# Patient Record
Sex: Female | Born: 1995 | Race: Black or African American | Marital: Single | Smoking: Never smoker
Health system: Southern US, Community
[De-identification: ages and names within clinical notes are randomized; demographics above are authoritative.]

## PROBLEM LIST (undated history)

## (undated) DIAGNOSIS — Z789 Other specified health status: Secondary | ICD-10-CM

## (undated) HISTORY — PX: WISDOM TOOTH EXTRACTION: SHX21

## (undated) HISTORY — DX: Other specified health status: Z78.9

---

## 2018-06-02 ENCOUNTER — Ambulatory Visit (INDEPENDENT_AMBULATORY_CARE_PROVIDER_SITE_OTHER): Payer: 59 | Admitting: Family Medicine

## 2018-06-02 ENCOUNTER — Encounter: Payer: Self-pay | Admitting: Family Medicine

## 2018-06-02 DIAGNOSIS — Z3401 Encounter for supervision of normal first pregnancy, first trimester: Secondary | ICD-10-CM

## 2018-06-02 DIAGNOSIS — Z124 Encounter for screening for malignant neoplasm of cervix: Secondary | ICD-10-CM | POA: Diagnosis not present

## 2018-06-02 DIAGNOSIS — Z1151 Encounter for screening for human papillomavirus (HPV): Secondary | ICD-10-CM

## 2018-06-02 DIAGNOSIS — Z23 Encounter for immunization: Secondary | ICD-10-CM | POA: Diagnosis not present

## 2018-06-02 DIAGNOSIS — Z113 Encounter for screening for infections with a predominantly sexual mode of transmission: Secondary | ICD-10-CM | POA: Diagnosis not present

## 2018-06-02 DIAGNOSIS — Z3400 Encounter for supervision of normal first pregnancy, unspecified trimester: Secondary | ICD-10-CM | POA: Insufficient documentation

## 2018-06-02 LAB — POCT URINALYSIS DIPSTICK OB
Blood, UA: NEGATIVE
Glucose, UA: NEGATIVE
KETONES UA: 40
Leukocytes, UA: NEGATIVE
POC,PROTEIN,UA: NEGATIVE
Spec Grav, UA: 1.015 (ref 1.010–1.025)
UROBILINOGEN UA: 2 U/dL — AB

## 2018-06-02 NOTE — Progress Notes (Signed)
  Subjective:  Angelica Gilbert is a G1P0 4457w5d being seen today for her first obstetrical visit.  Her obstetrical history is significant for first pregnancy. FOB involved: boyfriend. Unplanned pregnancy, but is excited about it.. Patient does intend to breast feed. Pregnancy history fully reviewed.  Patient reports no complaints.  BP 118/72   Pulse (!) 108   Ht 5\' 7"  (1.702 m)   Wt 128 lb (58.1 kg)   LMP 04/16/2018 (Exact Date)   BMI 20.05 kg/m   HISTORY: OB History  Gravida Para Term Preterm AB Living  1            SAB TAB Ectopic Multiple Live Births               # Outcome Date GA Lbr Len/2nd Weight Sex Delivery Anes PTL Lv  1 Current             History reviewed. No pertinent past medical history.  History reviewed. No pertinent surgical history.  Family History  Problem Relation Age of Onset  . Diabetes Father      Exam  BP 118/72   Pulse (!) 108   Ht 5\' 7"  (1.702 m)   Wt 128 lb (58.1 kg)   LMP 04/16/2018 (Exact Date)   BMI 20.05 kg/m   CONSTITUTIONAL: Well-developed, well-nourished female in no acute distress.  HENT:  Normocephalic, atraumatic, External right and left ear normal. Oropharynx is clear and moist EYES: Conjunctivae and EOM are normal. Pupils are equal, round, and reactive to light. No scleral icterus.  NECK: Normal range of motion, supple, no masses.  Normal thyroid.  CARDIOVASCULAR: Normal heart rate noted, regular rhythm RESPIRATORY: Clear to auscultation bilaterally. Effort and breath sounds normal, no problems with respiration noted. BREASTS: Symmetric in size. No masses, skin changes, nipple drainage, or lymphadenopathy. ABDOMEN: Soft, normal bowel sounds, no distention noted.  No tenderness, rebound or guarding.  PELVIC: Normal appearing external genitalia; normal appearing vaginal mucosa and cervix. No abnormal discharge noted. Normal uterine size, no other palpable masses, no uterine or adnexal tenderness. MUSCULOSKELETAL: Normal range of  motion. No tenderness.  No cyanosis, clubbing, or edema.  2+ distal pulses. SKIN: Skin is warm and dry. No rash noted. Not diaphoretic. No erythema. No pallor. NEUROLOGIC: Alert and oriented to person, place, and time. Normal reflexes, muscle tone coordination. No cranial nerve deficit noted. PSYCHIATRIC: Normal mood and affect. Normal behavior. Normal judgment and thought content.    Assessment:    Pregnancy: G1P0 Patient Active Problem List   Diagnosis Date Noted  . Supervision of normal first pregnancy, antepartum 06/02/2018      Plan:   1. Supervision of normal first pregnancy, antepartum FHT and FH normal. - Flu Vaccine QUAD 36+ mos IM - Obstetric Panel, Including HIV - Hemoglobinopathy Evaluation - SMN1 COPY NUMBER ANALYSIS (SMA Carrier Screen) - CHL AMB BABYSCRIPTS OPT IN - Cytology - PAP( [place]) - Hemoglobin A1c - POC Urinalysis Dipstick OB     Problem list reviewed and updated. 75% of 45 min visit spent on counseling and coordination of care.     Levie HeritageJacob J Yeng Perz 06/02/2018

## 2018-06-02 NOTE — Progress Notes (Signed)
DATING AND VIABILITY SONOGRAM   Adrienne MochaKayla Defrain is a 22 y.o. year old G1P0 with LMP Patient's last menstrual period was 04/16/2018 (exact date). which would correlate to  7217w5d weeks gestation.  She has regular menstrual cycles.   She is here today for a confirmatory initial sonogram.    GESTATION: SINGLETON     FETAL ACTIVITY:          Heart rate      145          The fetus is active.    ADNEXA: The ovaries are normal.   GESTATIONAL AGE AND  BIOMETRICS:  Gestational criteria: Estimated Date of Delivery: 01/21/19 by LMP now at 5617w5d  Previous Scans:0  GESTATIONAL SAC          2.66 cm         6-5 weeks  CROWN RUMP LENGTH           0.686 cm        6-3 weeks                                                                               AVERAGE EGA(BY THIS SCAN)6-3weeks  WORKING EDD( early ultrasound ):  01-21-19     TECHNICIAN COMMENTS: Patient informed that the ultrasound is considered a limited obstetric ultrasound and is not intended to be a complete ultrasound exam. Patient also informed that the ultrasound is not being completed with the intent of assessing for fetal or placental anomalies or any pelvic abnormalities. Explained that the purpose of today's ultrasound is to assess for fetal heart rate. Patient acknowledges the purpose of the exam and the limitations of the study.    Armandina StammerJennifer [place] Severs 06/02/2018 1:31 PM

## 2018-06-05 LAB — CULTURE, OB URINE

## 2018-06-05 LAB — URINE CULTURE, OB REFLEX

## 2018-06-07 ENCOUNTER — Telehealth: Payer: Self-pay

## 2018-06-07 MED ORDER — FOLIVANE-OB 130-92.4-1 MG PO CAPS: 1.0000 | ORAL_CAPSULE | Freq: Every day | ORAL | 10 refills | Status: DC

## 2018-06-07 NOTE — Telephone Encounter (Signed)
Patient requested prenatal vitamin. Angelica StammerJennifer Taylormarie Register RN

## 2018-06-08 LAB — CYTOLOGY - PAP
Chlamydia: NEGATIVE
Diagnosis: UNDETERMINED — AB
HPV: DETECTED — AB
NEISSERIA GONORRHEA: NEGATIVE

## 2018-06-09 ENCOUNTER — Encounter: Payer: Self-pay | Admitting: Family Medicine

## 2018-06-09 DIAGNOSIS — R87610 Atypical squamous cells of undetermined significance on cytologic smear of cervix (ASC-US): Secondary | ICD-10-CM | POA: Insufficient documentation

## 2018-06-09 DIAGNOSIS — N870 Mild cervical dysplasia: Secondary | ICD-10-CM | POA: Insufficient documentation

## 2018-06-09 DIAGNOSIS — R87810 Cervical high risk human papillomavirus (HPV) DNA test positive: Secondary | ICD-10-CM

## 2018-06-10 ENCOUNTER — Encounter: Payer: Self-pay | Admitting: Family Medicine

## 2018-06-10 DIAGNOSIS — D573 Sickle-cell trait: Secondary | ICD-10-CM | POA: Insufficient documentation

## 2018-06-10 DIAGNOSIS — O99019 Anemia complicating pregnancy, unspecified trimester: Secondary | ICD-10-CM

## 2018-06-10 LAB — OBSTETRIC PANEL, INCLUDING HIV
ANTIBODY SCREEN: NEGATIVE
BASOS: 0 %
Basophils Absolute: 0 10*3/uL (ref 0.0–0.2)
EOS (ABSOLUTE): 0 10*3/uL (ref 0.0–0.4)
EOS: 0 %
HIV Screen 4th Generation wRfx: NONREACTIVE
Hematocrit: 37.6 % (ref 34.0–46.6)
Hemoglobin: 12.7 g/dL (ref 11.1–15.9)
Hepatitis B Surface Ag: NEGATIVE
IMMATURE GRANS (ABS): 0 10*3/uL (ref 0.0–0.1)
Immature Granulocytes: 0 %
Lymphocytes Absolute: 2.2 10*3/uL (ref 0.7–3.1)
Lymphs: 21 %
MCH: 27.7 pg (ref 26.6–33.0)
MCHC: 33.8 g/dL (ref 31.5–35.7)
MCV: 82 fL (ref 79–97)
MONOS ABS: 0.6 10*3/uL (ref 0.1–0.9)
Monocytes: 6 %
Neutrophils Absolute: 7.3 10*3/uL — ABNORMAL HIGH (ref 1.4–7.0)
Neutrophils: 73 %
Platelets: 311 10*3/uL (ref 150–450)
RBC: 4.58 x10E6/uL (ref 3.77–5.28)
RDW: 13.6 % (ref 12.3–15.4)
RPR Ser Ql: NONREACTIVE
Rh Factor: NEGATIVE
Rubella Antibodies, IGG: 8.36 index (ref >0.99)
WBC: 10.2 10*3/uL (ref 3.4–10.8)

## 2018-06-10 LAB — SMN1 COPY NUMBER ANALYSIS (SMA CARRIER SCREENING)

## 2018-06-10 LAB — HEMOGLOBIN A1C
ESTIMATED AVERAGE GLUCOSE: 105 mg/dL
Hgb A1c MFr Bld: 5.3 % (ref 4.8–5.6)

## 2018-06-10 LAB — HEMOGLOBINOPATHY EVALUATION
Ferritin: 183 ng/mL — ABNORMAL HIGH (ref 15–150)
Hgb A2 Quant: 3.8 % — ABNORMAL HIGH (ref 1.8–3.2)
Hgb A: 60.9 % — ABNORMAL LOW (ref 96.4–98.8)
Hgb C: 0 %
Hgb F Quant: 0.5 % (ref 0.0–2.0)
Hgb S: 34.8 % — ABNORMAL HIGH
Hgb Solubility: POSITIVE — AB
Hgb Variant: 0 %

## 2018-06-22 NOTE — L&D Delivery Note (Signed)
Patient: Angelica Gilbert MRN: [id number]  GBS status: Positive, IAP given: PCN   Patient is a 23 y.o. now G1P1 s/p NSVD at [redacted]w[redacted]d, who was admitted for SOL. AROM approximately 80 minutes prior to delivery with clear  fluid.    Delivery Note At 1:57 AM a viable female was delivered via Vaginal, Spontaneous (Presentation: LOA).  APGAR: 9, 9; weight 7 lb 2.6 oz (3250 g).   Placenta status: spontaneous, intact.  Cord: 3 vessel with the following complications: nuchal x1.    Anesthesia:  Epidural  Episiotomy: None Lacerations: Periurethral Suture Repair: None  Est. Blood Loss (mL): 26  Head delivered LOA. Nuchal cord x1 noted, unable to reduce at perineum. Shoulder and body delivered in usual fashion. Nuchal cord then reduced. Infant with spontaneous cry, placed on mother's abdomen, dried and bulb suctioned. Cord clamped x 2 after 1-minute delay, and cut by family member. Cord blood drawn. Placenta delivered spontaneously with gentle cord traction. Fundus firm with massage and Pitocin. Perineum and vagina inspected and found to have small, shallow periurethral laceration, which was found to be hemostatic.  Mom to postpartum.  Baby to Couplet care / Skin to Skin.  Mother asymptomatic COVID+. Requests to remain with baby.   Melina Schools 01/10/2019, 8:25 AM

## 2018-06-24 MED ORDER — PROMETHAZINE HCL 25 MG PO TABS: 25.0000 mg | ORAL_TABLET | Freq: Four times a day (QID) | ORAL | 2 refills | Status: DC | PRN

## 2018-06-24 NOTE — Addendum Note (Signed)
Addended by: Levie Heritage on: 06/24/2018 04:02 PM   Modules accepted: Orders

## 2018-06-30 ENCOUNTER — Ambulatory Visit (INDEPENDENT_AMBULATORY_CARE_PROVIDER_SITE_OTHER): Payer: 59 | Admitting: Obstetrics & Gynecology

## 2018-06-30 VITALS — BP 125/66 | HR 119 | Wt 131.0 lb

## 2018-06-30 DIAGNOSIS — O99019 Anemia complicating pregnancy, unspecified trimester: Secondary | ICD-10-CM

## 2018-06-30 DIAGNOSIS — R87810 Cervical high risk human papillomavirus (HPV) DNA test positive: Secondary | ICD-10-CM

## 2018-06-30 DIAGNOSIS — D573 Sickle-cell trait: Secondary | ICD-10-CM

## 2018-06-30 DIAGNOSIS — Z3401 Encounter for supervision of normal first pregnancy, first trimester: Secondary | ICD-10-CM

## 2018-06-30 DIAGNOSIS — O99011 Anemia complicating pregnancy, first trimester: Secondary | ICD-10-CM

## 2018-06-30 DIAGNOSIS — R87610 Atypical squamous cells of undetermined significance on cytologic smear of cervix (ASC-US): Secondary | ICD-10-CM

## 2018-06-30 DIAGNOSIS — Z3400 Encounter for supervision of normal first pregnancy, unspecified trimester: Secondary | ICD-10-CM

## 2018-06-30 DIAGNOSIS — Z3A10 10 weeks gestation of pregnancy: Secondary | ICD-10-CM

## 2018-06-30 NOTE — Addendum Note (Signed)
Addended by: Mikey Bussing on: 06/30/2018 04:29 PM   Modules accepted: Orders

## 2018-06-30 NOTE — Progress Notes (Signed)
   PRENATAL VISIT NOTE  Subjective:  Angelica Gilbert is a 23 y.o. G1P0 at [redacted]w[redacted]d being seen today for ongoing prenatal care.  She is currently monitored for the following issues for this low-risk pregnancy and has Supervision of normal first pregnancy, antepartum; ASCUS with positive high risk HPV cervical; and Sickle cell trait in mother affecting pregnancy (HCC) on their problem list.  Patient reports no complaints.the N/V are improving. Pt has a conjunctival hemorrhage which is improving.   Contractions: Not present. Vag. Bleeding: None.  Movement: Absent. Denies leaking of fluid.   The following portions of the patient's history were reviewed and updated as appropriate: allergies, current medications, past family history, past medical history, past social history, past surgical history and problem list. Problem list updated.  Objective:   Vitals:   06/30/18 1542  BP: 125/66  Pulse: (!) 119  Weight: 131 lb (59.4 kg)    Fetal Status:     Movement: Absent     General:  Alert, oriented and cooperative. Patient is in no acute distress.  Skin: Skin is warm and dry. No rash noted.   Cardiovascular: Normal heart rate noted  Respiratory: Normal respiratory effort, no problems with respiration noted  Abdomen: Soft, gravid, appropriate for gestational age.  Pain/Pressure: Absent     Pelvic: Cervical exam deferred        Extremities: Normal range of motion.  Edema: None  Mental Status: Normal mood and affect. Normal behavior. Normal judgment and thought content.   Assessment and Plan:  Pregnancy: G1P0 at [redacted]w[redacted]d  1. Supervision of normal first pregnancy, antepartum Panorama today  2. Sickle cell trait in mother affecting pregnancy (HCC) Rec testing for FOB  3. ASCUS with positive high risk HPV cervical  Preterm labor symptoms and general obstetric precautions including but not limited to vaginal bleeding, contractions, leaking of fluid and fetal movement were reviewed in detail with the  patient. Please refer to After Visit Summary for other counseling recommendations.  Return in about 4 weeks (around 07/28/2018).  No future appointments.  Willodean Rosenthal, MD

## 2018-07-04 ENCOUNTER — Telehealth: Payer: Self-pay

## 2018-07-04 NOTE — Telephone Encounter (Signed)
Received a form from [place] stating that patient's panorama specimen can not be processed because of missing information.   Glory Buff, spoke with Shantel. Shantel states that Angelica Gilbert wants to confirm that the patient is having a singleton.

## 2018-07-11 DIAGNOSIS — Z3400 Encounter for supervision of normal first pregnancy, unspecified trimester: Secondary | ICD-10-CM

## 2018-07-28 ENCOUNTER — Ambulatory Visit (INDEPENDENT_AMBULATORY_CARE_PROVIDER_SITE_OTHER): Payer: 59 | Admitting: Family Medicine

## 2018-07-28 ENCOUNTER — Encounter: Payer: Self-pay | Admitting: Family Medicine

## 2018-07-28 VITALS — BP 103/75 | HR 85 | Wt 133.0 lb

## 2018-07-28 DIAGNOSIS — O99012 Anemia complicating pregnancy, second trimester: Secondary | ICD-10-CM

## 2018-07-28 DIAGNOSIS — D573 Sickle-cell trait: Secondary | ICD-10-CM

## 2018-07-28 DIAGNOSIS — O99019 Anemia complicating pregnancy, unspecified trimester: Secondary | ICD-10-CM

## 2018-07-28 DIAGNOSIS — Z3402 Encounter for supervision of normal first pregnancy, second trimester: Secondary | ICD-10-CM

## 2018-07-28 DIAGNOSIS — Z3400 Encounter for supervision of normal first pregnancy, unspecified trimester: Secondary | ICD-10-CM

## 2018-07-28 NOTE — Addendum Note (Signed)
Addended by: Levie Heritage on: 07/28/2018 03:22 PM   Modules accepted: Orders

## 2018-07-28 NOTE — Progress Notes (Signed)
   PRENATAL VISIT NOTE  Subjective:  Angelica Gilbert is a 23 y.o. G1P0 at [redacted]w[redacted]d being seen today for ongoing prenatal care.  She is currently monitored for the following issues for this low-risk pregnancy and has Supervision of normal first pregnancy, antepartum; ASCUS with positive high risk HPV cervical; and Sickle cell trait in mother affecting pregnancy (HCC) on their problem list.  Patient reports no complaints.  Contractions: Not present. Vag. Bleeding: None.  Movement: Absent. Denies leaking of fluid.   The following portions of the patient's history were reviewed and updated as appropriate: allergies, current medications, past family history, past medical history, past social history, past surgical history and problem list. Problem list updated.  Objective:   Vitals:   07/28/18 1451  BP: 103/75  Pulse: 85  Weight: 133 lb (60.3 kg)    Fetal Status: Fetal Heart Rate (bpm): 162   Movement: Absent     General:  Alert, oriented and cooperative. Patient is in no acute distress.  Skin: Skin is warm and dry. No rash noted.   Cardiovascular: Normal heart rate noted  Respiratory: Normal respiratory effort, no problems with respiration noted  Abdomen: Soft, gravid, appropriate for gestational age.  Pain/Pressure: Present     Pelvic: Cervical exam deferred        Extremities: Normal range of motion.  Edema: None  Mental Status: Normal mood and affect. Normal behavior. Normal judgment and thought content.   Assessment and Plan:  Pregnancy: G1P0 at [redacted]w[redacted]d  1. Supervision of normal first pregnancy, antepartum FHT and FH normal - [place] MFM OB Comp Less 14 Wks; Future  2. Sickle cell trait in mother affecting pregnancy (HCC)   Preterm labor symptoms and general obstetric precautions including but not limited to vaginal bleeding, contractions, leaking of fluid and fetal movement were reviewed in detail with the patient. Please refer to After Visit Summary for other counseling recommendations.   No follow-ups on file.  Future Appointments  Date Time Provider Department Center  08/25/2018  9:45 AM Levie Heritage, DO [facility] None    Levie Heritage, DO

## 2018-08-25 ENCOUNTER — Ambulatory Visit (HOSPITAL_COMMUNITY): Payer: 59

## 2018-08-25 ENCOUNTER — Ambulatory Visit (INDEPENDENT_AMBULATORY_CARE_PROVIDER_SITE_OTHER): Payer: 59 | Admitting: Family Medicine

## 2018-08-25 VITALS — BP 110/73 | HR 100 | Wt 135.1 lb

## 2018-08-25 DIAGNOSIS — Z3400 Encounter for supervision of normal first pregnancy, unspecified trimester: Secondary | ICD-10-CM

## 2018-08-25 DIAGNOSIS — Z3A18 18 weeks gestation of pregnancy: Secondary | ICD-10-CM

## 2018-08-25 DIAGNOSIS — O99019 Anemia complicating pregnancy, unspecified trimester: Secondary | ICD-10-CM

## 2018-08-25 DIAGNOSIS — O99012 Anemia complicating pregnancy, second trimester: Secondary | ICD-10-CM

## 2018-08-25 DIAGNOSIS — D573 Sickle-cell trait: Secondary | ICD-10-CM

## 2018-08-25 NOTE — Progress Notes (Signed)
   PRENATAL VISIT NOTE  Subjective:  Angelica Gilbert is a 23 y.o. G1P0 at [redacted]w[redacted]d being seen today for ongoing prenatal care.  She is currently monitored for the following issues for this low-risk pregnancy and has Supervision of normal first pregnancy, antepartum; ASCUS with positive high risk HPV cervical; and Sickle cell trait in mother affecting pregnancy (HCC) on their problem list.  Patient reports no complaints.  Contractions: Not present. Vag. Bleeding: None.  Movement: Present. Denies leaking of fluid.   The following portions of the patient's history were reviewed and updated as appropriate: allergies, current medications, past family history, past medical history, past social history, past surgical history and problem list. Problem list updated.  Objective:   Vitals:   08/25/18 0943  BP: 110/73  Pulse: 100  Weight: 135 lb 1.3 oz (61.3 kg)    Fetal Status: Fetal Heart Rate (bpm): 160   Movement: Present     General:  Alert, oriented and cooperative. Patient is in no acute distress.  Skin: Skin is warm and dry. No rash noted.   Cardiovascular: Normal heart rate noted  Respiratory: Normal respiratory effort, no problems with respiration noted  Abdomen: Soft, gravid, appropriate for gestational age.  Pain/Pressure: Present     Pelvic: Cervical exam deferred        Extremities: Normal range of motion.  Edema: None  Mental Status: Normal mood and affect. Normal behavior. Normal judgment and thought content.   Assessment and Plan:  Pregnancy: G1P0 at [redacted]w[redacted]d  1. Supervision of normal first pregnancy, antepartum FHT and FH normal  2. Sickle cell trait in mother affecting pregnancy (HCC)   Preterm labor symptoms and general obstetric precautions including but not limited to vaginal bleeding, contractions, leaking of fluid and fetal movement were reviewed in detail with the patient. Please refer to After Visit Summary for other counseling recommendations.  Return in about 4 weeks  (around 09/22/2018).  Future Appointments  Date Time Provider Department Center  08/31/2018 10:45 AM [facility] [place] 2 [facility]    Levie Heritage, DO

## 2018-08-31 ENCOUNTER — Other Ambulatory Visit: Payer: Self-pay

## 2018-08-31 ENCOUNTER — Ambulatory Visit (HOSPITAL_COMMUNITY)
Admission: RE | Admit: 2018-08-31 | Discharge: 2018-08-31 | Disposition: A | Discharge status: Home / Self Care | Payer: 59 | Source: Ambulatory Visit | Attending: Family Medicine | Admitting: Family Medicine

## 2018-08-31 ENCOUNTER — Other Ambulatory Visit (HOSPITAL_COMMUNITY): Payer: Self-pay | Admitting: *Deleted

## 2018-08-31 ENCOUNTER — Other Ambulatory Visit: Payer: Self-pay | Admitting: Family Medicine

## 2018-08-31 DIAGNOSIS — Z363 Encounter for antenatal screening for malformations: Secondary | ICD-10-CM | POA: Diagnosis not present

## 2018-08-31 DIAGNOSIS — Z3400 Encounter for supervision of normal first pregnancy, unspecified trimester: Secondary | ICD-10-CM | POA: Diagnosis not present

## 2018-08-31 DIAGNOSIS — Z3A19 19 weeks gestation of pregnancy: Secondary | ICD-10-CM | POA: Diagnosis not present

## 2018-08-31 DIAGNOSIS — Z862 Personal history of diseases of the blood and blood-forming organs and certain disorders involving the immune mechanism: Secondary | ICD-10-CM | POA: Diagnosis not present

## 2018-08-31 DIAGNOSIS — Z362 Encounter for other antenatal screening follow-up: Secondary | ICD-10-CM

## 2018-09-22 ENCOUNTER — Ambulatory Visit (INDEPENDENT_AMBULATORY_CARE_PROVIDER_SITE_OTHER): Payer: 59 | Admitting: Obstetrics & Gynecology

## 2018-09-22 ENCOUNTER — Encounter: Payer: Self-pay | Admitting: Obstetrics & Gynecology

## 2018-09-22 DIAGNOSIS — Z3A22 22 weeks gestation of pregnancy: Secondary | ICD-10-CM

## 2018-09-22 DIAGNOSIS — R87610 Atypical squamous cells of undetermined significance on cytologic smear of cervix (ASC-US): Secondary | ICD-10-CM

## 2018-09-22 DIAGNOSIS — O99012 Anemia complicating pregnancy, second trimester: Secondary | ICD-10-CM

## 2018-09-22 DIAGNOSIS — Z3400 Encounter for supervision of normal first pregnancy, unspecified trimester: Secondary | ICD-10-CM

## 2018-09-22 DIAGNOSIS — O99019 Anemia complicating pregnancy, unspecified trimester: Secondary | ICD-10-CM

## 2018-09-22 DIAGNOSIS — D573 Sickle-cell trait: Secondary | ICD-10-CM

## 2018-09-22 DIAGNOSIS — R87810 Cervical high risk human papillomavirus (HPV) DNA test positive: Secondary | ICD-10-CM

## 2018-09-22 NOTE — Progress Notes (Signed)
Patient complaining of really bad reflux. Armandina Stammer RN

## 2018-09-22 NOTE — Progress Notes (Signed)
   TELEHEALTH VIRTUAL OBSTETRICS VISIT ENCOUNTER NOTE  I connected with Angelica Gilbert on 09/22/18 at  4:00 PM EDT by telephone at home and verified that I am speaking with the correct person using two identifiers.   I discussed the limitations, risks, security and privacy concerns of performing an evaluation and management service by telephone and the availability of in person appointments. I also discussed with the patient that there may be a patient responsible charge related to this service. The patient expressed understanding and agreed to proceed.  Subjective:  Angelica Gilbert is a 22 y.o. G1P0 at [redacted]w[redacted]d being followed for ongoing prenatal care.  She is currently monitored for the following issues for this low-risk pregnancy and has Supervision of normal first pregnancy, antepartum; ASCUS with positive high risk HPV cervical; and Sickle cell trait in mother affecting pregnancy (HCC) on their problem list.  Patient reports no complaints. Reports fetal movement. Denies any contractions, bleeding or leaking of fluid.   The following portions of the patient's history were reviewed and updated as appropriate: allergies, current medications, past family history, past medical history, past social history, past surgical history and problem list.   Objective:   General:  Alert, oriented and cooperative.   Mental Status: Normal mood and affect perceived. Normal judgment and thought content.  Rest of physical exam deferred due to type of encounter  Assessment and Plan:  Pregnancy: G1P0 at [redacted]w[redacted]d 1. Supervision of normal first pregnancy, antepartum  2. Sickle cell trait in mother affecting pregnancy (HCC)  3. ASCUS with positive high risk HPV cervical   Preterm labor symptoms and general obstetric precautions including but not limited to vaginal bleeding, contractions, leaking of fluid and fetal movement were reviewed in detail with the patient.  I discussed the assessment and treatment plan with  the patient. The patient was provided an opportunity to ask questions and all were answered. The patient agreed with the plan and demonstrated an understanding of the instructions. The patient was advised to call back or seek an in-person office evaluation/go to [facility] at [facility] for any urgent or concerning symptoms. Please refer to After Visit Summary for other counseling recommendations.   I provided 12 minutes of non-face-to-face time during this encounter.  Return in about 5 weeks (around 10/27/2018).  Future Appointments  Date Time Provider Department Center  10/27/2018  3:40 PM [facility] NURSE [facility]  10/27/2018  3:45 PM [facility] [place] 2 [facility]    Willodean Rosenthal, MD Center for [facility], [facility]

## 2018-09-29 ENCOUNTER — Ambulatory Visit (HOSPITAL_COMMUNITY): Payer: 59

## 2018-10-27 ENCOUNTER — Ambulatory Visit (INDEPENDENT_AMBULATORY_CARE_PROVIDER_SITE_OTHER): Payer: 59 | Admitting: Family Medicine

## 2018-10-27 ENCOUNTER — Ambulatory Visit (HOSPITAL_COMMUNITY)
Admission: RE | Admit: 2018-10-27 | Discharge: 2018-10-27 | Disposition: A | Discharge status: Home / Self Care | Payer: 59 | Source: Ambulatory Visit | Attending: Obstetrics and Gynecology | Admitting: Obstetrics and Gynecology

## 2018-10-27 ENCOUNTER — Encounter (HOSPITAL_COMMUNITY): Payer: Self-pay | Admitting: *Deleted

## 2018-10-27 ENCOUNTER — Ambulatory Visit (HOSPITAL_COMMUNITY): Payer: 59 | Admitting: *Deleted

## 2018-10-27 ENCOUNTER — Other Ambulatory Visit: Payer: Self-pay

## 2018-10-27 ENCOUNTER — Encounter: Payer: Self-pay | Admitting: Family Medicine

## 2018-10-27 VITALS — BP 124/72 | HR 92 | Wt 136.0 lb

## 2018-10-27 DIAGNOSIS — Z3400 Encounter for supervision of normal first pregnancy, unspecified trimester: Secondary | ICD-10-CM | POA: Diagnosis present

## 2018-10-27 DIAGNOSIS — Z362 Encounter for other antenatal screening follow-up: Secondary | ICD-10-CM | POA: Diagnosis not present

## 2018-10-27 DIAGNOSIS — Z3A27 27 weeks gestation of pregnancy: Secondary | ICD-10-CM | POA: Diagnosis not present

## 2018-10-27 DIAGNOSIS — Z862 Personal history of diseases of the blood and blood-forming organs and certain disorders involving the immune mechanism: Secondary | ICD-10-CM | POA: Diagnosis not present

## 2018-10-27 DIAGNOSIS — O99019 Anemia complicating pregnancy, unspecified trimester: Secondary | ICD-10-CM

## 2018-10-27 DIAGNOSIS — O360920 Maternal care for other rhesus isoimmunization, second trimester, not applicable or unspecified: Secondary | ICD-10-CM

## 2018-10-27 DIAGNOSIS — O26899 Other specified pregnancy related conditions, unspecified trimester: Secondary | ICD-10-CM

## 2018-10-27 DIAGNOSIS — O99012 Anemia complicating pregnancy, second trimester: Secondary | ICD-10-CM

## 2018-10-27 DIAGNOSIS — O26892 Other specified pregnancy related conditions, second trimester: Secondary | ICD-10-CM

## 2018-10-27 DIAGNOSIS — Z23 Encounter for immunization: Secondary | ICD-10-CM

## 2018-10-27 DIAGNOSIS — D573 Sickle-cell trait: Secondary | ICD-10-CM

## 2018-10-27 DIAGNOSIS — Z6791 Unspecified blood type, Rh negative: Secondary | ICD-10-CM | POA: Insufficient documentation

## 2018-10-27 MED ORDER — RHO D IMMUNE GLOBULIN 1500 UNIT/2ML IJ SOSY: 300.0000 ug | PREFILLED_SYRINGE | Freq: Once | INTRAMUSCULAR | Status: DC

## 2018-10-27 NOTE — Progress Notes (Signed)
error 

## 2018-10-27 NOTE — Progress Notes (Signed)
   PRENATAL VISIT NOTE  Subjective:  Angelica Gilbert is a 23 y.o. G1P0 at [redacted]w[redacted]d being seen today for ongoing prenatal care.  She is currently monitored for the following issues for this low-risk pregnancy and has Supervision of normal first pregnancy, antepartum; ASCUS with positive high risk HPV cervical; and Sickle cell trait in mother affecting pregnancy (HCC) on their problem list.  Patient reports no complaints.  Contractions: Not present. Vag. Bleeding: None.  Movement: Present. Denies leaking of fluid.   The following portions of the patient's history were reviewed and updated as appropriate: allergies, current medications, past family history, past medical history, past social history, past surgical history and problem list.   Objective:   Vitals:   10/27/18 0838  BP: 124/72  Pulse: 92    Fetal Status: Fetal Heart Rate (bpm): 155   Movement: Present     General:  Alert, oriented and cooperative. Patient is in no acute distress.  Skin: Skin is warm and dry. No rash noted.   Cardiovascular: Normal heart rate noted  Respiratory: Normal respiratory effort, no problems with respiration noted  Abdomen: Soft, gravid, appropriate for gestational age.  Pain/Pressure: Present     Pelvic: Cervical exam deferred        Extremities: Normal range of motion.  Edema: None  Mental Status: Normal mood and affect. Normal behavior. Normal judgment and thought content.   Assessment and Plan:  Pregnancy: G1P0 at [redacted]w[redacted]d 1. Supervision of normal first pregnancy, antepartum FHT and FH normal - Glucose Tolerance, 2 Hours w/1 Hour - CBC - HIV antibody (with reflex) - RPR - ABO AND RH   2. Rh negative state in antepartum period Will return next week for rhogam  3. Sickle cell trait in mother affecting pregnancy (HCC)   Preterm labor symptoms and general obstetric precautions including but not limited to vaginal bleeding, contractions, leaking of fluid and fetal movement were reviewed in detail  with the patient. Please refer to After Visit Summary for other counseling recommendations.   Return in about 4 weeks (around 11/24/2018) for OB f/u, virtual.  Future Appointments  Date Time Provider Department Center  10/27/2018  3:40 PM [facility] NURSE [facility]  10/27/2018  3:45 PM [facility] [place] 2 [facility]    Levie Heritage, DO

## 2018-10-28 LAB — GLUCOSE TOLERANCE, 2 HOURS W/ 1HR
Glucose, 1 hour: 115 mg/dL (ref 65–179)
Glucose, 2 hour: 106 mg/dL (ref 65–152)
Glucose, Fasting: 70 mg/dL (ref 65–91)

## 2018-10-28 LAB — CBC
Hematocrit: 36.9 % (ref 34.0–46.6)
Hemoglobin: 12.2 g/dL (ref 11.1–15.9)
MCH: 27.7 pg (ref 26.6–33.0)
MCHC: 33.1 g/dL (ref 31.5–35.7)
MCV: 84 fL (ref 79–97)
Platelets: 230 10*3/uL (ref 150–450)
RBC: 4.41 x10E6/uL (ref 3.77–5.28)
RDW: 13.7 % (ref 11.7–15.4)
WBC: 12.2 10*3/uL — ABNORMAL HIGH (ref 3.4–10.8)

## 2018-10-28 LAB — HIV ANTIBODY (ROUTINE TESTING W REFLEX): HIV Screen 4th Generation wRfx: NONREACTIVE

## 2018-10-28 LAB — RPR: RPR Ser Ql: NONREACTIVE

## 2018-10-28 LAB — ABO AND RH: Rh Factor: NEGATIVE

## 2018-10-28 LAB — ANTIBODY SCREEN: Antibody Screen: NEGATIVE

## 2018-11-01 ENCOUNTER — Ambulatory Visit (INDEPENDENT_AMBULATORY_CARE_PROVIDER_SITE_OTHER): Payer: 59

## 2018-11-01 ENCOUNTER — Other Ambulatory Visit: Payer: Self-pay

## 2018-11-01 DIAGNOSIS — Z6791 Unspecified blood type, Rh negative: Secondary | ICD-10-CM

## 2018-11-01 DIAGNOSIS — O360130 Maternal care for anti-D [Rh] antibodies, third trimester, not applicable or unspecified: Secondary | ICD-10-CM

## 2018-11-01 NOTE — Progress Notes (Signed)
Chart reviewed - agree with RN documentation.   

## 2018-11-01 NOTE — Progress Notes (Signed)
Patient presents for Rhogam shot. Patient blood type A-

## 2018-11-24 ENCOUNTER — Ambulatory Visit (INDEPENDENT_AMBULATORY_CARE_PROVIDER_SITE_OTHER): Payer: 59 | Admitting: Family Medicine

## 2018-11-24 DIAGNOSIS — O99019 Anemia complicating pregnancy, unspecified trimester: Secondary | ICD-10-CM

## 2018-11-24 DIAGNOSIS — O99013 Anemia complicating pregnancy, third trimester: Secondary | ICD-10-CM

## 2018-11-24 DIAGNOSIS — O26899 Other specified pregnancy related conditions, unspecified trimester: Secondary | ICD-10-CM

## 2018-11-24 DIAGNOSIS — Z3A31 31 weeks gestation of pregnancy: Secondary | ICD-10-CM

## 2018-11-24 DIAGNOSIS — D573 Sickle-cell trait: Secondary | ICD-10-CM

## 2018-11-24 DIAGNOSIS — Z3400 Encounter for supervision of normal first pregnancy, unspecified trimester: Secondary | ICD-10-CM

## 2018-11-24 DIAGNOSIS — O26893 Other specified pregnancy related conditions, third trimester: Secondary | ICD-10-CM

## 2018-11-24 DIAGNOSIS — Z6791 Unspecified blood type, Rh negative: Secondary | ICD-10-CM

## 2018-11-24 DIAGNOSIS — O360930 Maternal care for other rhesus isoimmunization, third trimester, not applicable or unspecified: Secondary | ICD-10-CM

## 2018-11-24 NOTE — Progress Notes (Signed)
Patient agrees to [employer]. Armandina Stammer RN

## 2018-11-24 NOTE — Progress Notes (Signed)
TELEHEALTH OBSTETRICS PRENATAL VIRTUAL VIDEO VISIT ENCOUNTER NOTE  Provider [facility]: [facility]   I connected with Angelica Gilbert on 11/24/18 at  9:15 AM EDT by WebEx Video Encounter at home and verified that I am speaking with the correct person using two identifiers.   I discussed the limitations, risks, security and privacy concerns of performing an evaluation and management service by telephone and the availability of in person appointments. I also discussed with the patient that there may be a patient responsible charge related to this service. The patient expressed understanding and agreed to proceed. Subjective:  Angelica Gilbert is a 23 y.o. G1P0 at 1363w5d being seen today for ongoing prenatal care.  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  Patient reports occasional contractions.  Contractions: Not present. Vag. Bleeding: None.  Movement: Present. Denies any leaking of fluid.   The following portions of the patient's history were reviewed and updated as appropriate: allergies, current medications, past family history, past medical history, past social history, past surgical history and problem list.   Objective:  There were no vitals filed for this visit.  Fetal Status:     Movement: Present     General:  Alert, oriented and cooperative. Patient is in no acute distress.  Respiratory: Normal respiratory effort, no problems with respiration noted  Mental Status: Normal mood and affect. Normal behavior. Normal judgment and thought content.  Rest of physical exam deferred due to type of encounter  Imaging: [place] Mfm Ob Follow Up  Result Date: 10/28/2018  ----------------------------------------------------------------------  OBSTETRICS REPORT                       (Signed Final 10/28/2018 11:35 am) ---------------------------------------------------------------------- Patient Info  ID #:       [id number]                          D.O.B.:  07-24-1995 (22 yrs)  Name:       Angelica Gilbert                    Visit Date: 10/27/2018 03:24 pm ---------------------------------------------------------------------- Performed By  Performed By:     Percell BostonHeather Waken          [address]. Address[address]     [address]                                                             [address]  Attending:        Lin Landsmanorenthian Booker      [facility]  Referred By:      [facility] ---------------------------------------------------------------------- Orders   #  Description                          Code         Ordered By   1  [place] MFM OB FOLLOW UP                  [id number]     RAVI SHANKAR  ----------------------------------------------------------------------   #  Order #                    Accession #                 Episode #   1  [id number]                  [id number]                  [id number]  ---------------------------------------------------------------------- Indications   [redacted] weeks gestation of pregnancy                Z3A.27   History of sickle cell trait (FOB status       Z86.2   unknown)   Encounter for other antenatal screening        Z36.2   follow-up (low risk NIPS)  ---------------------------------------------------------------------- Fetal Evaluation  Num Of Fetuses:         1  Fetal Heart Rate(bpm):  116  Cardiac Activity:       Observed  Presentation:           Cephalic  Placenta:               Anterior  P. Cord Insertion:      Previously Visualized  Amniotic Fluid  AFI FV:      Within normal limits                              Largest Pocket(cm)                               6.96 ---------------------------------------------------------------------- Biometry  BPD:      73.4  mm     G. Age:  29w 3d         88  %    CI:        79.66   %    70 - 86                                                          FL/HC:      20.2   %    18.8 - 20.6  HC:      259.9  mm     G. Age:  28w 2d         38  %    HC/AC:      1.12        1.05 - 1.21  AC:      231.7  mm     G. Age:  27w 4d         37  %  FL/BPD:     71.4   %    71 - 87  FL:       52.4  mm     G. Age:  27w 6d         41  %    FL/AC:      22.6   %    20 - 24  HUM:      46.3  mm     G. Age:  27w 2d         37  %  Est. FW:    1136  gm      2 lb 8 oz     54  % ---------------------------------------------------------------------- OB History  Gravidity:    1         Term:   0        Prem:   0        SAB:   0  TOP:          0       Ectopic:  0        Living: 0 ---------------------------------------------------------------------- Gestational Age  LMP:           27w 5d        Date:  04/16/18                 EDD:   01/21/19  U/S Today:     28w 2d                                        EDD:   01/17/19  Best:          27w 5d     Det. By:  LMP  (04/16/18)          EDD:   01/21/19 ---------------------------------------------------------------------- Anatomy  Cranium:               Appears normal         LVOT:                   Appears normal  Cavum:                 Previously seen        Aortic Arch:            Previously seen  Ventricles:            Appears normal         Ductal Arch:            Previously seen  Choroid Plexus:        Previously seen        Diaphragm:              Appears normal  Cerebellum:            Previously seen        Stomach:                Appears normal, left  sided  Posterior Fossa:       Previously seen        Abdomen:                Previously seen  Nuchal Fold:           Previously seen        Abdominal Wall:         Previously seen  Face:                   Orbits and profile     Cord Vessels:           Previously seen                         previously seen  Lips:                  Previously seen        Kidneys:                Appear normal  Palate:                Previously seen        Bladder:                Appears normal  Thoracic:              Appears normal         Spine:                  Appears normal  Heart:                 Appears normal         Upper Extremities:      Previously seen                         (4CH, axis, and                         situs)  RVOT:                  Appears normal         Lower Extremities:      Previously seen  Other:  Heels and 5th digit visualized previously. ---------------------------------------------------------------------- Cervix Uterus Adnexa  Cervix  Not visualized (advanced GA >24wks)  Uterus  No abnormality visualized.  Left Ovary  No adnexal mass visualized.  Right Ovary  No adnexal mass visualized.  Cul De Sac  No free fluid seen.  Adnexa  No abnormality visualized. ---------------------------------------------------------------------- Impression  Normal interval growth. ---------------------------------------------------------------------- Recommendations  Follow up growth as clinically indicated. ----------------------------------------------------------------------               Lin Landsman, MD Electronically Signed Final Report   10/28/2018 11:35 am ----------------------------------------------------------------------   Assessment and Plan:  Pregnancy: G1P0 at [redacted]w[redacted]d 1. Supervision of normal first pregnancy, antepartum Good fetal activity  2. Rh negative state in antepartum period Rhogam given at 28 weeks.  3. Sickle cell trait in mother affecting pregnancy (HCC)   Preterm labor symptoms and general obstetric precautions including but not limited to vaginal bleeding, contractions, leaking of fluid and fetal movement were reviewed in detail with the patient. I discussed the  assessment and treatment plan with the patient. The patient was provided an opportunity to ask questions and all were answered. The patient  agreed with the plan and demonstrated an understanding of the instructions. The patient was advised to call back or seek an in-person office evaluation/go to MAU at [facility] for any urgent or concerning symptoms. Please refer to After Visit Summary for other counseling recommendations.    I provided 13 minutes of face-to-face time during this encounter.  Return in about 4 weeks (around 12/22/2018) for OB f/u, In Office - [facility].  Future Appointments  Date Time Provider Department Center  11/24/2018  9:15 AM Levie Heritage, DO [facility] None    Levie Heritage, DO Center for [employer], [facility]

## 2018-12-20 ENCOUNTER — Other Ambulatory Visit: Payer: Self-pay

## 2018-12-20 ENCOUNTER — Telehealth: Payer: Self-pay

## 2018-12-20 ENCOUNTER — Inpatient Hospital Stay (HOSPITAL_COMMUNITY)
Admission: AD | Admit: 2018-12-20 | Discharge: 2018-12-20 | Disposition: A | Discharge status: Home / Self Care | Payer: 59 | Attending: Obstetrics and Gynecology | Admitting: Obstetrics and Gynecology

## 2018-12-20 ENCOUNTER — Encounter (HOSPITAL_COMMUNITY): Payer: Self-pay

## 2018-12-20 DIAGNOSIS — O4703 False labor before 37 completed weeks of gestation, third trimester: Secondary | ICD-10-CM

## 2018-12-20 DIAGNOSIS — M546 Pain in thoracic spine: Secondary | ICD-10-CM | POA: Diagnosis not present

## 2018-12-20 DIAGNOSIS — O26893 Other specified pregnancy related conditions, third trimester: Secondary | ICD-10-CM | POA: Diagnosis present

## 2018-12-20 DIAGNOSIS — Z3A35 35 weeks gestation of pregnancy: Secondary | ICD-10-CM | POA: Diagnosis not present

## 2018-12-20 DIAGNOSIS — O9A213 Injury, poisoning and certain other consequences of external causes complicating pregnancy, third trimester: Secondary | ICD-10-CM

## 2018-12-20 MED ORDER — LACTATED RINGERS IV BOLUS
1000.0000 mL | Freq: Once | INTRAVENOUS | Status: AC
  Administered 2018-12-20: 1000 mL via INTRAVENOUS

## 2018-12-20 MED ORDER — CYCLOBENZAPRINE HCL 5 MG PO TABS: 5.0000 mg | ORAL_TABLET | Freq: Three times a day (TID) | ORAL | 0 refills | Status: DC | PRN

## 2018-12-20 MED ORDER — ACETAMINOPHEN 500 MG PO TABS
1000.0000 mg | ORAL_TABLET | Freq: Once | ORAL | Status: AC
  Administered 2018-12-20: 12:00:00 1000 mg via ORAL
  Filled 2018-12-20: qty 2

## 2018-12-20 MED ORDER — LACTATED RINGERS IV SOLN
INTRAVENOUS | Status: DC
  Administered 2018-12-20: 14:00:00 via INTRAVENOUS
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
## 2018-12-20 NOTE — Discharge Instructions (Signed)

## 2018-12-22 ENCOUNTER — Encounter: Payer: Self-pay | Admitting: Family Medicine

## 2018-12-22 ENCOUNTER — Ambulatory Visit (INDEPENDENT_AMBULATORY_CARE_PROVIDER_SITE_OTHER): Payer: 59 | Admitting: Family Medicine

## 2018-12-22 ENCOUNTER — Other Ambulatory Visit: Payer: Self-pay

## 2018-12-22 VITALS — BP 120/67 | HR 100 | Wt 156.0 lb

## 2018-12-22 DIAGNOSIS — Z3400 Encounter for supervision of normal first pregnancy, unspecified trimester: Secondary | ICD-10-CM

## 2018-12-22 DIAGNOSIS — Z113 Encounter for screening for infections with a predominantly sexual mode of transmission: Secondary | ICD-10-CM

## 2018-12-22 DIAGNOSIS — O360930 Maternal care for other rhesus isoimmunization, third trimester, not applicable or unspecified: Secondary | ICD-10-CM

## 2018-12-22 DIAGNOSIS — Z3A35 35 weeks gestation of pregnancy: Secondary | ICD-10-CM

## 2018-12-22 DIAGNOSIS — O26893 Other specified pregnancy related conditions, third trimester: Secondary | ICD-10-CM

## 2018-12-22 DIAGNOSIS — Z6791 Unspecified blood type, Rh negative: Secondary | ICD-10-CM

## 2018-12-22 DIAGNOSIS — R87810 Cervical high risk human papillomavirus (HPV) DNA test positive: Secondary | ICD-10-CM

## 2018-12-22 DIAGNOSIS — R87610 Atypical squamous cells of undetermined significance on cytologic smear of cervix (ASC-US): Secondary | ICD-10-CM

## 2018-12-22 NOTE — Progress Notes (Signed)
Patient complaining of discharge for a few days. Kathrene Alu RN
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
## 2018-12-25 LAB — CULTURE, BETA STREP (GROUP B ONLY): Strep Gp B Culture: POSITIVE — AB

## 2018-12-26 ENCOUNTER — Other Ambulatory Visit: Payer: Self-pay

## 2018-12-26 ENCOUNTER — Inpatient Hospital Stay (HOSPITAL_COMMUNITY)
Admission: AD | Admit: 2018-12-26 | Discharge: 2018-12-26 | Disposition: A | Discharge status: Home / Self Care | Payer: 59 | Attending: Obstetrics and Gynecology | Admitting: Obstetrics and Gynecology

## 2018-12-26 ENCOUNTER — Encounter (HOSPITAL_COMMUNITY): Payer: Self-pay | Admitting: *Deleted

## 2018-12-26 ENCOUNTER — Encounter: Payer: Self-pay | Admitting: Family Medicine

## 2018-12-26 DIAGNOSIS — Z3A36 36 weeks gestation of pregnancy: Secondary | ICD-10-CM | POA: Diagnosis not present

## 2018-12-26 DIAGNOSIS — O26893 Other specified pregnancy related conditions, third trimester: Secondary | ICD-10-CM | POA: Insufficient documentation

## 2018-12-26 DIAGNOSIS — B951 Streptococcus, group B, as the cause of diseases classified elsewhere: Secondary | ICD-10-CM | POA: Insufficient documentation

## 2018-12-26 DIAGNOSIS — Z3689 Encounter for other specified antenatal screening: Secondary | ICD-10-CM

## 2018-12-26 DIAGNOSIS — N898 Other specified noninflammatory disorders of vagina: Secondary | ICD-10-CM

## 2018-12-26 DIAGNOSIS — Z3400 Encounter for supervision of normal first pregnancy, unspecified trimester: Secondary | ICD-10-CM

## 2018-12-26 LAB — GC/CHLAMYDIA PROBE AMP (~~LOC~~) NOT AT ARMC
Chlamydia: NEGATIVE
Neisseria Gonorrhea: NEGATIVE

## 2018-12-26 LAB — POCT FERN TEST: POCT Fern Test: NEGATIVE

## 2018-12-26 NOTE — Discharge Instructions (Signed)
Braxton Hicks Contractions Contractions of the uterus can occur throughout pregnancy, but they are not always a sign that you are in labor. You may have practice contractions called Braxton Hicks contractions. These false labor contractions are sometimes confused with true labor. What are Braxton Hicks contractions? Braxton Hicks contractions are tightening movements that occur in the muscles of the uterus before labor. Unlike true labor contractions, these contractions do not result in opening (dilation) and thinning of the cervix. Toward the end of pregnancy (32-34 weeks), Braxton Hicks contractions can happen more often and may become stronger. These contractions are sometimes difficult to tell apart from true labor because they can be very uncomfortable. You should not feel embarrassed if you go to the hospital with false labor. Sometimes, the only way to tell if you are in true labor is for your health care provider to look for changes in the cervix. The health care provider will do a physical exam and may monitor your contractions. If you are not in true labor, the exam should show that your cervix is not dilating and your water has not broken. If there are no other health problems associated with your pregnancy, it is completely safe for you to be sent home with false labor. You may continue to have Braxton Hicks contractions until you go into true labor. How to tell the difference between true labor and false labor True labor  Contractions last 30-70 seconds.  Contractions become very regular.  Discomfort is usually felt in the top of the uterus, and it spreads to the lower abdomen and low back.  Contractions do not go away with walking.  Contractions usually become more intense and increase in frequency.  The cervix dilates and gets thinner. False labor  Contractions are usually shorter and not as strong as true labor contractions.  Contractions are usually irregular.  Contractions  are often felt in the front of the lower abdomen and in the groin.  Contractions may go away when you walk around or change positions while lying down.  Contractions get weaker and are shorter-lasting as time goes on.  The cervix usually does not dilate or become thin. Follow these instructions at home:   Take over-the-counter and prescription medicines only as told by your health care provider.  Keep up with your usual exercises and follow other instructions from your health care provider.  Eat and drink lightly if you think you are going into labor.  If Braxton Hicks contractions are making you uncomfortable: ? Change your position from lying down or resting to walking, or change from walking to resting. ? Sit and rest in a tub of warm water. ? Drink enough fluid to keep your urine pale yellow. Dehydration may cause these contractions. ? Do slow and deep breathing several times an hour.  Keep all follow-up prenatal visits as told by your health care provider. This is important. Contact a health care provider if:  You have a fever.  You have continuous pain in your abdomen. Get help right away if:  Your contractions become stronger, more regular, and closer together.  You have fluid leaking or gushing from your vagina.  You pass blood-tinged mucus (bloody show).  You have bleeding from your vagina.  You have low back pain that you never had before.  You feel your baby's head pushing down and causing pelvic pressure.  Your baby is not moving inside you as much as it used to. Summary  Contractions that occur before labor are   called Braxton Hicks contractions, false labor, or practice contractions.  Braxton Hicks contractions are usually shorter, weaker, farther apart, and less regular than true labor contractions. True labor contractions usually become progressively stronger and regular, and they become more frequent.  Manage discomfort from Braxton Hicks contractions  by changing position, resting in a warm bath, drinking plenty of water, or practicing deep breathing. This information is not intended to replace advice given to you by your health care provider. Make sure you discuss any questions you have with your health care provider. Document Released: 10/22/2016 Document Revised: 05/21/2017 Document Reviewed: 10/22/2016 Elsevier Patient Education  2020 [employer]  

## 2018-12-26 NOTE — MAU Provider Note (Signed)
History   [id number]   Chief Complaint  Patient presents with  . Contractions  . Rupture of Membranes    HPI Angelica Gilbert is a 23 y.o. female  G1P0000 @36 .2 wks here with report of leaking clear fluid since last week.  No gush of fluid. Had sex 3 days ago. Pt reports daily irregular contractions. She denies vaginal bleeding. She reports good fetal movement. All other systems negative.    Patient's last menstrual period was 04/16/2018 (exact date).  OB History  Gravida Para Term Preterm AB Living  1 0 0 0 0 0  SAB TAB Ectopic Multiple Live Births  0 0 0 0 0    # Outcome Date GA Lbr Len/2nd Weight Sex Delivery Anes PTL Lv  1 Current             Past Medical History:  Diagnosis Date  . Medical history non-contributory     Family History  Problem Relation Age of Onset  . Diabetes Father     Social History   Socioeconomic History  . Marital status: Single    Spouse name: Not on file  . Number of children: Not on file  . Years of education: Not on file  . Highest education level: Not on file  Occupational History  . Not on file  Social Needs  . Financial resource strain: Not on file  . Food insecurity    Worry: Not on file    Inability: Not on file  . Transportation needs    Medical: Not on file    Non-medical: Not on file  Tobacco Use  . Smoking status: Never Smoker  . Smokeless tobacco: Never Used  Substance and Sexual Activity  . Alcohol use: Never    Frequency: Never  . Drug use: Never  . Sexual activity: Yes    Birth control/protection: None  Lifestyle  . Physical activity    Days per week: Not on file    Minutes per session: Not on file  . Stress: Not on file  Relationships  . Social Herbalist on phone: Not on file    Gets together: Not on file    Attends religious service: Not on file    Active member of club or organization: Not on file    Attends meetings of clubs or organizations: Not on file    Relationship status: Not on  file  Other Topics Concern  . Not on file  Social History Narrative  . Not on file    No Known Allergies  No current facility-administered medications on file prior to encounter.    Current Outpatient Medications on File Prior to Encounter  Medication Sig Dispense Refill  . cyclobenzaprine (FLEXERIL) 5 MG tablet Take 1 tablet (5 mg total) by mouth 3 (three) times daily as needed for muscle spasms. 10 tablet 0  . Prenat w/o A Vit-FeFum-FePo-FA (FOLIVANE-OB) 130-92.4-1 MG CAPS Take 1 tablet by mouth daily. 30 capsule 10     Review of Systems  Gastrointestinal: Negative for abdominal pain.  Genitourinary: Positive for vaginal discharge. Negative for vaginal bleeding.     Physical Exam   Vitals:   12/26/18 1655  BP: 120/71  Pulse: (!) 106  Resp: 16  Temp: 98.3 F (36.8 C)  TempSrc: Oral  SpO2: 98%  Weight: 70.7 kg  Height: 5\' 7"  (1.702 m)    Physical Exam  Nursing note and vitals reviewed. Constitutional: She is oriented to person, place, and time. She  appears well-developed and well-nourished. No distress.  HENT:  Head: Normocephalic and atraumatic.  Neck: Normal range of motion.  Cardiovascular: Normal rate.  Respiratory: Effort normal. No respiratory distress.  Genitourinary:    Genitourinary Comments: SSE: no pool, fern neg; +thin white discharge SVE: 1/70/-1, vtx   Musculoskeletal: Normal range of motion.  Neurological: She is alert and oriented to person, place, and time.  Skin: Skin is warm and dry.  Psychiatric: She has a normal mood and affect.  EFM: 135 bpm, mod variability, + accels, no decels Toco: irregular  Results for orders placed or performed during the hospital encounter of 12/26/18 (from the past 24 hour(s))  POCT fern test     Status: None   Collection Time: 12/26/18  5:38 PM  Result Value Ref Range   POCT Fern Test Negative = intact amniotic membranes    MAU Course  Procedures  MDM Labs ordered and reviewed. No evidence of PTL or  SROM. Stable for discharge home.   Assessment and Plan   1. [redacted] weeks gestation of pregnancy   2. Supervision of normal first pregnancy, antepartum   3. NST (non-stress test) reactive   4. Vaginal discharge during pregnancy in third trimester    Discharge home Follow up at [facility] as scheduled PTL precautions  Allergies as of 12/26/2018   No Known Allergies     Medication List    TAKE these medications   cyclobenzaprine 5 MG tablet Commonly known as: FLEXERIL Take 1 tablet (5 mg total) by mouth 3 (three) times daily as needed for muscle spasms.   Folivane-OB 130-92.4-1 MG Caps Take 1 tablet by mouth daily.       Donette LarryBhambri, Erick Oxendine, CNM 12/26/2018 6:23 PM

## 2018-12-26 NOTE — MAU Note (Signed)
Been contracting since last Fri. Getting stronger throughout the day.  "had one strong, one moderate". Has been passing her mucous plug, now is more watery. First noted water, on Wed, hasn't been a lot, just frequent small amounts.

## 2018-12-29 ENCOUNTER — Ambulatory Visit (INDEPENDENT_AMBULATORY_CARE_PROVIDER_SITE_OTHER): Payer: 59 | Admitting: Family Medicine

## 2018-12-29 DIAGNOSIS — Z3A36 36 weeks gestation of pregnancy: Secondary | ICD-10-CM

## 2018-12-29 DIAGNOSIS — Z6791 Unspecified blood type, Rh negative: Secondary | ICD-10-CM

## 2018-12-29 DIAGNOSIS — O26893 Other specified pregnancy related conditions, third trimester: Secondary | ICD-10-CM

## 2018-12-29 DIAGNOSIS — O360930 Maternal care for other rhesus isoimmunization, third trimester, not applicable or unspecified: Secondary | ICD-10-CM

## 2018-12-29 DIAGNOSIS — Z3400 Encounter for supervision of normal first pregnancy, unspecified trimester: Secondary | ICD-10-CM

## 2018-12-29 DIAGNOSIS — B951 Streptococcus, group B, as the cause of diseases classified elsewhere: Secondary | ICD-10-CM

## 2018-12-29 DIAGNOSIS — O98813 Other maternal infectious and parasitic diseases complicating pregnancy, third trimester: Secondary | ICD-10-CM

## 2018-12-29 NOTE — Progress Notes (Signed)
   [place] VIRTUAL VIDEO VISIT ENCOUNTER NOTE  Provider location: [facility] [place] [facility]   I connected with Angelica Gilbert on 12/29/18 at  9:30 AM EDT by WebEx Video Encounter at home and verified that I am speaking with the correct person using two identifiers.   I discussed the limitations, risks, security and privacy concerns of performing an evaluation and management service virtually and the availability of in person appointments. I also discussed with the patient that there may be a patient responsible charge related to this service. The patient expressed understanding and agreed to proceed. Subjective:  Angelica Gilbert is a 23 y.o. G1P0000 at [redacted]w[redacted]d being seen today for ongoing prenatal care.  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  Patient reports occasional contractions.  Contractions: Irregular. Vag. Bleeding: None.  Movement: Present. Denies any leaking of fluid.   The following portions of the patient's history were reviewed and updated as appropriate: allergies, current medications, past family history, past medical history, past social history, past surgical history and problem list.   Objective:  There were no vitals filed for this visit.  Fetal Status:     Movement: Present     General:  Alert, oriented and cooperative. Patient is in no acute distress.  Respiratory: Normal respiratory effort, no problems with respiration noted  Mental Status: Normal mood and affect. Normal behavior. Normal judgment and thought content.  Rest of physical exam deferred due to type of encounter  Imaging: No results found.  Assessment and Plan:  Pregnancy: G1P0000 at  [redacted]w[redacted]d 1. Supervision of normal first pregnancy, antepartum Good fetal movement. Occasional contractions  2. Positive GBS test Intrapartum antibiotics  3. Rh negative state in antepartum period   Preterm labor symptoms and general obstetric precautions including but not limited to vaginal bleeding, contractions, leaking of fluid and fetal movement were reviewed in detail with the patient. I discussed the assessment and treatment plan with the patient. The patient was provided an opportunity to ask questions and all were answered. The patient agreed with the plan and demonstrated an understanding of the instructions. The patient was advised to call back or seek an in-person office evaluation/go to MAU at [facility] for any urgent or concerning symptoms. Please refer to After Visit Summary for other counseling recommendations.   I provided 11 minutes of face-to-face time during this encounter.  No follow-ups on file.  No future appointments.  [place] [facility] [employer], [place]

## 2018-12-29 NOTE — Progress Notes (Signed)
Pt does not have a BP cuff.

## 2019-01-04 ENCOUNTER — Telehealth: Payer: Self-pay

## 2019-01-04 NOTE — Telephone Encounter (Signed)
Patient called and is 37. 4 weeks. Patient complaining of occasional sharp pains in her belly. Patient denies any bleeding or leaking of fluid. Does note she has some brown tinged discharge. Patient reports baby is moving well.   Patient has appointment with [place] on Friday July 17th and made her aware that she can have braxton hicks contractions at this point. Explained if the pain get where she is unable to walk./talk to anyone and they are space every 5-10 minutes apart would be when she needs to go to the hospital for evaluation. Patient states understanding. Kathrene Alu RN

## 2019-01-05 ENCOUNTER — Encounter (HOSPITAL_COMMUNITY): Payer: Self-pay

## 2019-01-05 ENCOUNTER — Other Ambulatory Visit: Payer: Self-pay

## 2019-01-05 ENCOUNTER — Inpatient Hospital Stay (HOSPITAL_COMMUNITY)
Admission: AD | Admit: 2019-01-05 | Discharge: 2019-01-05 | Disposition: A | Discharge status: Home / Self Care | Payer: 59 | Source: Ambulatory Visit | Attending: Obstetrics and Gynecology | Admitting: Obstetrics and Gynecology

## 2019-01-05 DIAGNOSIS — O479 False labor, unspecified: Secondary | ICD-10-CM

## 2019-01-05 DIAGNOSIS — O471 False labor at or after 37 completed weeks of gestation: Secondary | ICD-10-CM | POA: Insufficient documentation

## 2019-01-05 DIAGNOSIS — Z3A37 37 weeks gestation of pregnancy: Secondary | ICD-10-CM

## 2019-01-05 NOTE — MAU Provider Note (Signed)
S: Patient is here for RN labor evaluation. Strip, vital signs, & chart Reviewed   O:  Vitals:   01/05/19 0141 01/05/19 0145 01/05/19 0155 01/05/19 0200  BP: 122/83     Pulse: (!) 104     Resp:      Temp:      TempSrc:      SpO2:  100% 98% 99%  Weight:       No results found for this or any previous visit (from the past 24 hour(s)).  Dilation: 2.5 Effacement (%): 70 Cervical Position: Posterior Station: 0 Presentation: Vertex Exam by:: benji stanleyRN   FHR: 125 bpm, Mod Var, no Decels, 15x15 Accels UC: Q5-9 minutes   A: 1. False labor   2. [redacted] weeks gestation of pregnancy      P:  RN to discharge home in stable condition with return precautions & fetal kick counts  Jorje Guild FNP 2:51 AM

## 2019-01-05 NOTE — Discharge Instructions (Signed)
Signs and Symptoms of Labor Labor is your body's natural process of moving your baby, placenta, and umbilical cord out of your uterus. The process of labor usually starts when your baby is full-term, between 37 and 40 weeks of pregnancy. How will I know when I am close to going into labor? As your body prepares for labor and the birth of your baby, you may notice the following symptoms in the weeks and days before true labor starts:  Having a strong desire to get your home ready to receive your new baby. This is called nesting. Nesting may be a sign that labor is approaching, and it may occur several weeks before birth. Nesting may involve cleaning and organizing your home.  Passing a small amount of thick, bloody mucus out of your vagina (normal bloody show or losing your mucus plug). This may happen more than a week before labor begins, or it might occur right before labor begins as the opening of the cervix starts to widen (dilate). For some women, the entire mucus plug passes at once. For others, smaller portions of the mucus plug may gradually pass over several days.  Your baby moving (dropping) lower in your pelvis to get into position for birth (lightening). When this happens, you may feel more pressure on your bladder and pelvic bone and less pressure on your ribs. This may make it easier to breathe. It may also cause you to need to urinate more often and have problems with bowel movements.  Having "practice contractions" (Braxton Hicks contractions) that occur at irregular (unevenly spaced) intervals that are more than 10 minutes apart. This is also called false labor. False labor contractions are common after exercise or sexual activity, and they will stop if you change position, rest, or drink fluids. These contractions are usually mild and do not get stronger over time. They may feel like: ? A backache or back pain. ? Mild cramps, similar to menstrual cramps. ? Tightening or pressure in  your abdomen. Other early symptoms that labor may be starting soon include:  Nausea or loss of appetite.  Diarrhea.  Having a sudden burst of energy, or feeling very tired.  Mood changes.  Having trouble sleeping. How will I know when labor has begun? Signs that true labor has begun may include:  Having contractions that come at regular (evenly spaced) intervals and increase in intensity. This may feel like more intense tightening or pressure in your abdomen that moves to your back. ? Contractions may also feel like rhythmic pain in your upper thighs or back that comes and goes at regular intervals. ? For first-time mothers, this change in intensity of contractions often occurs at a more gradual pace. ? Women who have given birth before may notice a more rapid progression of contraction changes.  Having a feeling of pressure in the vaginal area.  Your water breaking (rupture of membranes). This is when the sac of fluid that surrounds your baby breaks. When this happens, you will notice fluid leaking from your vagina. This may be clear or blood-tinged. Labor usually starts within 24 hours of your water breaking, but it may take longer to begin. ? Some women notice this as a gush of fluid. ? Others notice that their underwear repeatedly becomes damp. Follow these instructions at home:   When labor starts, or if your water breaks, call your health care provider or nurse care line. Based on your situation, they will determine when you should go in for an   exam.  When you are in early labor, you may be able to rest and manage symptoms at home. Some strategies to try at home include: ? Breathing and relaxation techniques. ? Taking a warm bath or shower. ? Listening to music. ? Using a heating pad on the lower back for pain. If you are directed to use heat:  Place a towel between your skin and the heat source.  Leave the heat on for 20-30 minutes.  Remove the heat if your skin turns  bright red. This is especially important if you are unable to feel pain, heat, or cold. You may have a greater risk of getting burned. Get help right away if:  You have painful, regular contractions that are 5 minutes apart or less.  Labor starts before you are [redacted] weeks along in your pregnancy.  You have a fever.  You have a headache that does not go away.  You have bright red blood coming from your vagina.  You do not feel your baby moving.  You have a sudden onset of: ? Severe headache with vision problems. ? Nausea, vomiting, or diarrhea. ? Chest pain or shortness of breath. These symptoms may be an emergency. If your health care provider recommends that you go to the hospital or birth center where you plan to deliver, do not drive yourself. Have someone else drive you, or call emergency services (911 in the [place] Summary  Labor is your body's natural process of moving your baby, placenta, and umbilical cord out of your uterus.  The process of labor usually starts when your baby is full-term, between 37 and 40 weeks of pregnancy.  When labor starts, or if your water breaks, call your health care provider or nurse care line. Based on your situation, they will determine when you should go in for an exam. This information is not intended to replace advice given to you by your health care provider. Make sure you discuss any questions you have with your health care provider. Document Released: 11/13/2016 Document Revised: 03/08/2017 Document Reviewed: 11/13/2016 Elsevier Patient Education  2020 Elsevier Inc.  Fetal Movement Counts Patient Name: ________________________________________________ Patient Due Date: ____________________ What is a fetal movement count?  A fetal movement count is the number of times that you feel your baby move during a certain amount of time. This may also be called a fetal kick count. A fetal movement count is recommended for every pregnant woman. You may  be asked to start counting fetal movements as early as week 28 of your pregnancy. Pay attention to when your baby is most active. You may notice your baby's sleep and wake cycles. You may also notice things that make your baby move more. You should do a fetal movement count:  When your baby is normally most active.  At the same time each day. A good time to count movements is while you are resting, after having something to eat and drink. How do I count fetal movements? 1. Find a quiet, comfortable area. Sit, or lie down on your side. 2. Write down the date, the start time and stop time, and the number of movements that you felt between those two times. Take this information with you to your health care visits. 3. For 2 hours, count kicks, flutters, swishes, rolls, and jabs. You should feel at least 10 movements during 2 hours. 4. You may stop counting after you have felt 10 movements. 5. If you do not feel 10 movements in 2   hours, have something to eat and drink. Then, keep resting and counting for 1 hour. If you feel at least 4 movements during that hour, you may stop counting. Contact a health care provider if:  You feel fewer than 4 movements in 2 hours.  Your baby is not moving like he or she usually does. Date: ____________ Start time: ____________ Stop time: ____________ Movements: ____________ Date: ____________ Start time: ____________ Stop time: ____________ Movements: ____________ Date: ____________ Start time: ____________ Stop time: ____________ Movements: ____________ Date: ____________ Start time: ____________ Stop time: ____________ Movements: ____________ Date: ____________ Start time: ____________ Stop time: ____________ Movements: ____________ Date: ____________ Start time: ____________ Stop time: ____________ Movements: ____________ Date: ____________ Start time: ____________ Stop time: ____________ Movements: ____________ Date: ____________ Start time: ____________ Stop  time: ____________ Movements: ____________ Date: ____________ Start time: ____________ Stop time: ____________ Movements: ____________ This information is not intended to replace advice given to you by your health care provider. Make sure you discuss any questions you have with your health care provider. Document Released: 07/08/2006 Document Revised: 06/28/2018 Document Reviewed: 07/18/2015 Elsevier Patient Education  2020 Elsevier Inc.  

## 2019-01-05 NOTE — MAU Note (Signed)
Pt reports to MAU c/o ctxs every 6-10 mins. No bleeding or LOF. +FM. Pt says the pain is a 6/10. Occasionally due to pressure in her vagina she gets the urge to push.

## 2019-01-06 ENCOUNTER — Ambulatory Visit (INDEPENDENT_AMBULATORY_CARE_PROVIDER_SITE_OTHER): Payer: 59 | Admitting: Obstetrics & Gynecology

## 2019-01-06 ENCOUNTER — Encounter: Payer: Self-pay | Admitting: Obstetrics & Gynecology

## 2019-01-06 VITALS — BP 105/68 | HR 88 | Wt 161.0 lb

## 2019-01-06 DIAGNOSIS — D573 Sickle-cell trait: Secondary | ICD-10-CM

## 2019-01-06 DIAGNOSIS — R87810 Cervical high risk human papillomavirus (HPV) DNA test positive: Secondary | ICD-10-CM

## 2019-01-06 DIAGNOSIS — Z6791 Unspecified blood type, Rh negative: Secondary | ICD-10-CM

## 2019-01-06 DIAGNOSIS — Z3400 Encounter for supervision of normal first pregnancy, unspecified trimester: Secondary | ICD-10-CM

## 2019-01-06 DIAGNOSIS — R87610 Atypical squamous cells of undetermined significance on cytologic smear of cervix (ASC-US): Secondary | ICD-10-CM

## 2019-01-06 DIAGNOSIS — O99013 Anemia complicating pregnancy, third trimester: Secondary | ICD-10-CM

## 2019-01-06 DIAGNOSIS — Z3A37 37 weeks gestation of pregnancy: Secondary | ICD-10-CM

## 2019-01-06 DIAGNOSIS — O99019 Anemia complicating pregnancy, unspecified trimester: Secondary | ICD-10-CM

## 2019-01-06 DIAGNOSIS — B951 Streptococcus, group B, as the cause of diseases classified elsewhere: Secondary | ICD-10-CM

## 2019-01-06 DIAGNOSIS — O26893 Other specified pregnancy related conditions, third trimester: Secondary | ICD-10-CM

## 2019-01-06 NOTE — Progress Notes (Signed)
   PRENATAL VISIT NOTE  Subjective:  Angelica Gilbert is a 23 y.o. G1P0000 at [redacted]w[redacted]d being seen today for ongoing prenatal care.  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  Patient reports reg ctx. Seen in the MAU last pm..  Contractions: Irritability. Vag. Bleeding: None.  Movement: Present. Denies leaking of fluid.   The following portions of the patient's history were reviewed and updated as appropriate: allergies, current medications, past family history, past medical history, past social history, past surgical history and problem list.   Objective:   Vitals:   01/06/19 0844  BP: 105/68  Pulse: 88  Weight: 161 lb (73 kg)    Fetal Status: Fetal Heart Rate (bpm): 130   Movement: Present     General:  Alert, oriented and cooperative. Patient is in no acute distress.  Skin: Skin is warm and dry. No rash noted.   Cardiovascular: Normal heart rate noted  Respiratory: Normal respiratory effort, no problems with respiration noted  Abdomen: Soft, gravid, appropriate for gestational age.  Pain/Pressure: Present     Pelvic: Cervical exam performed        Extremities: Normal range of motion.  Edema: Trace  Mental Status: Normal mood and affect. Normal behavior. Normal judgment and thought content.   Assessment and Plan:  Pregnancy: G1P0000 at [redacted]w[redacted]d 1. Supervision of normal first pregnancy, antepartum FHR and FH WNL  2. 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 Rh negative state in antepartum period S/p Rhogam   4. Positive GBS test Atbx in labor  Term labor symptoms and general obstetric precautions including but not limited to vaginal bleeding, contractions, leaking of fluid and fetal movement  were reviewed in detail with the patient. Please refer to After Visit Summary for other counseling recommendations.   Return in about 2 weeks (around 01/20/2019).  No future appointments.  Angelica Drafts, MD

## 2019-01-06 NOTE — Patient Instructions (Signed)
Pain Relief During Labor and Delivery Many things can cause pain during labor and delivery, including:  Pressure on bones and ligaments due to the baby moving through the pelvis.  Stretching of tissues due to the baby moving through the birth canal.  Muscle tension due to anxiety or nervousness.  The uterus tightening (contracting) and relaxing to help move the baby. There are many ways to deal with the pain of labor and delivery. They include:  Taking prenatal classes. Taking these classes helps you know what to expect during your babys birth. What you learn will increase your confidence and decrease your anxiety.  Practicing relaxation techniques or doing relaxing activities, such as: ? Focused breathing. ? Meditation. ? Visualization. ? Aroma therapy. ? Listening to your favorite music. ? Hypnosis.  Taking a warm shower or bath (hydrotherapy). This may: ? Provide comfort and relaxation. ? Lessen your perception of pain. ? Decrease the amount of pain medicine needed. ? Decrease the length of labor.  Getting a massage or counterpressure on your back.  Applying warm packs or ice packs.  Changing positions often, moving around, or using a birthing ball.  Getting: ? Pain medicine through an IV or injection into a muscle. ? Pain medicine inserted into your spinal column. ? Injections of sterile water just under the skin on your lower back (intradermal injections). ? Laughing gas (nitrous oxide). Discuss your pain control options with your health care provider during your prenatal visits. Explore the options offered by your hospital or birth center. What kinds of medicine are available? There are two kinds of medicines that can be used to relieve pain during labor and delivery:  Analgesics. These medicines decrease pain without causing you to lose feeling or the ability to move your muscles.  Anesthetics. These medicines block feeling in the body and can decrease your  ability to move freely. Both of these kinds of medicine can cause minor side effects, such as nausea, trouble concentrating, and sleepiness. They can also decrease the baby's heart rate before birth and affect the babys breathing rate after birth. For this reason, health care providers are careful about when and how much medicine is given. What are specific medicines and procedures that provide pain relief? Local Anesthetics Local anesthetics are used to numb a small area of the body. They may be used along with another kind of anesthetic or used to numb the nerves of the vagina, cervix, and perineum during the second stage of labor. General Anesthetics General anesthetics cause you to lose consciousness so you do not feel pain. They are usually only used for an emergency cesarean delivery. General anesthetics are given through an IV tube and a mask. Pudendal Block A pudendal block is a form of local anesthetic. It may be used to relieve the pain associated with pushing or stretching of the perineum at the time of delivery or to further numb the perineum. A pudendal block is done by injecting numbing medicine through the vaginal wall into a nerve in the pelvis. Epidural Analgesia Epidural analgesia is given through a flexible IV catheter that is inserted into the lower back. Numbing medicine is delivered continuously to the area near your spinal column nerves (epidural space). After having this type of analgesia, you may be able to move your legs but you most likely will not be able to walk. Depending on the amount of medicine given, you may lose all feeling in the lower half of your body, or you may retain some level  of sensation, including the urge to push. Epidural analgesia can be used to provide pain relief for a vaginal birth. Spinal Block A spinal block is similar to epidural analgesia, but the medicine is injected into the spinal fluid instead of the epidural space. A spinal block is only given  once. It starts to relieve pain quickly, but the pain relief lasts only 1-6 hours. Spinal blocks can be used for cesarean deliveries. Combined Spinal-Epidural (CSE) Block A CSE block combines the effects of a spinal block and epidural analgesia. The spinal block works quickly to block all pain. The epidural analgesia provides continuous pain relief, even after the effects of the spinal block have worn off. This information is not intended to replace advice given to you by your health care provider. Make sure you discuss any questions you have with your health care provider. Document Released: 09/24/2008 Document Revised: 05/21/2017 Document Reviewed: 10/30/2015 Elsevier Patient Education  2020 [place] Natural childbirth is when labor and delivery progresses naturally with minimal medical assistance or medicines. Natural childbirth may be an option if you have a low risk pregnancy. With the help of a birthing professional such as a midwife, doula, or other health care provider, you may be able to use relaxation techniques and controlled breathing to manage pain during labor. Many women choose natural childbirth because it makes them feel more in control and in touch with the experience of giving birth. Some women also choose natural childbirth because they are concerned that medicines may affect them and their babies. What types of natural childbirth techniques are available? There are two types of natural childbirth techniques, which are taught in classes:  The Lamaze method. In these classes, parents learn that having a baby is normal, healthy, and natural. Mothers are taught to take a neutral position regarding pain medicine and numbing medicines, and to make an informed decision about using these medicines if the time comes.  [employer], also called husband-coached birth. In these classes, the father or partner learns to be the birth coach. The mother is encouraged  to exercise and eat a balanced, nutritious diet. Both parents also learn relaxation and deep breathing exercises and are taught how to prepare for emergency situations. What are some natural pain and relaxation techniques? If you are considering a natural childbirth, you should explore your options for managing pain and discomfort during your labor and delivery. Some natural pain and relaxation techniques include:  Meditation.  Yoga.  Hypnosis.  Acupuncture.  Massage.  Changing positions, such as by walking, rocking, showering, or leaning on birth balls.  Lying in warm water or a whirlpool bath.  Finding an activity that keeps your mind off the labor pain.  Listening to soft music.  Focusing on a particular object (visual imagery). How can I prepare for a natural birth?  Talk with your spouse or partner about your goals for having a natural childbirth.  Decide if you will have your baby in the hospital, at a birthing center, or at home.  Have your spouse or partner attend the natural childbirth technique classes with you.  Decide which type of health care provider you would like to assist you with your delivery.  If you have other children, make plans to have someone take care of them when you go to the hospital or birthing center.  Know the distance and the time it takes to go to the hospital or birthing center. Practice going there and time it to  be sure.  Have a bag packed with a nightgown, bathrobe, and toiletries. Be ready to take it with you when you go into labor.  Keep phone numbers of your family and friends handy if you need to call someone when you go into labor.  Talk with your health care provider about the possibility of a medical emergency and what will happen if that occurs. What are the advantages and disadvantages of natural childbirth? Advantages  You are in control of your labor and delivery experience.  You may be able to avoid some common medical  practices, such as getting medicines or being monitored all the time.  You and your spouse or partner will work together, which can increase your bond with each other.  In most delivery centers, your family and friends can be involved in the labor and delivery process. Disadvantages  The methods of helping relieve labor pains may not work for you.  You may feel disappointed if you change your mind during labor and decide not to have a natural childbirth. What can I expect after delivery?  You may feel very tired.  You may feel uncomfortable as your uterus contracts.  The area around your vagina will feel sore.  You may feel cold and shaky. What questions should I ask my health care provider?  Am I a good candidate for natural childbirth?  Can you refer me to classes to learn more about natural childbirth?  How do I create a birth plan that outlines my wishes for natural childbirth? Where to find more information  American Pregnancy Association: americanpregnancy.org  [employer] of Obstetricians and Gynecologists: acog.org  [employer]-Midwives: www.midwife.org Summary  Natural childbirth may be an option if you have a low risk pregnancy.  The Elig[employer]ley or Lamaze Methods are techniques that can assist you in achieving a natural birth experience.  Talk to your health care provider to determine if you are a good candidate for a natural childbirth. This information is not intended to replace advice given to you by your health care provider. Make sure you discuss any questions you have with your health care provider. Document Released: 05/21/2008 Document Revised: 09/30/2018 Document Reviewed: 08/17/2016 Elsevier Patient Education  2020 [employer]

## 2019-01-06 NOTE — Progress Notes (Signed)
Patient was seen in MAU yesterday due to pressure she was having. Patient complaining that baby feel very low. Kathrene Alu RN

## 2019-01-09 ENCOUNTER — Ambulatory Visit (INDEPENDENT_AMBULATORY_CARE_PROVIDER_SITE_OTHER): Payer: 59 | Admitting: Obstetrics & Gynecology

## 2019-01-09 ENCOUNTER — Other Ambulatory Visit: Payer: Self-pay

## 2019-01-09 ENCOUNTER — Inpatient Hospital Stay (HOSPITAL_COMMUNITY)
Admission: AD | Admit: 2019-01-09 | Discharge: 2019-01-11 | DRG: 805 | Disposition: A | Discharge status: Home / Self Care | Payer: 59 | Attending: Obstetrics and Gynecology | Admitting: Obstetrics and Gynecology

## 2019-01-09 ENCOUNTER — Inpatient Hospital Stay (HOSPITAL_COMMUNITY): Payer: 59 | Admitting: Anesthesiology

## 2019-01-09 ENCOUNTER — Encounter (HOSPITAL_COMMUNITY): Payer: Self-pay

## 2019-01-09 VITALS — BP 113/73 | HR 107 | Wt 160.1 lb

## 2019-01-09 DIAGNOSIS — B951 Streptococcus, group B, as the cause of diseases classified elsewhere: Secondary | ICD-10-CM

## 2019-01-09 DIAGNOSIS — O9852 Other viral diseases complicating childbirth: Secondary | ICD-10-CM | POA: Diagnosis present

## 2019-01-09 DIAGNOSIS — D573 Sickle-cell trait: Secondary | ICD-10-CM | POA: Diagnosis present

## 2019-01-09 DIAGNOSIS — O99824 Streptococcus B carrier state complicating childbirth: Secondary | ICD-10-CM | POA: Diagnosis present

## 2019-01-09 DIAGNOSIS — O26893 Other specified pregnancy related conditions, third trimester: Secondary | ICD-10-CM

## 2019-01-09 DIAGNOSIS — O99019 Anemia complicating pregnancy, unspecified trimester: Secondary | ICD-10-CM | POA: Diagnosis present

## 2019-01-09 DIAGNOSIS — Z3A38 38 weeks gestation of pregnancy: Secondary | ICD-10-CM | POA: Diagnosis not present

## 2019-01-09 DIAGNOSIS — R87810 Cervical high risk human papillomavirus (HPV) DNA test positive: Secondary | ICD-10-CM

## 2019-01-09 DIAGNOSIS — U071 COVID-19: Secondary | ICD-10-CM | POA: Diagnosis present

## 2019-01-09 DIAGNOSIS — R87610 Atypical squamous cells of undetermined significance on cytologic smear of cervix (ASC-US): Secondary | ICD-10-CM | POA: Diagnosis present

## 2019-01-09 DIAGNOSIS — Z3400 Encounter for supervision of normal first pregnancy, unspecified trimester: Secondary | ICD-10-CM

## 2019-01-09 DIAGNOSIS — O9902 Anemia complicating childbirth: Secondary | ICD-10-CM | POA: Diagnosis present

## 2019-01-09 DIAGNOSIS — Z6791 Unspecified blood type, Rh negative: Secondary | ICD-10-CM

## 2019-01-09 DIAGNOSIS — O99820 Streptococcus B carrier state complicating pregnancy: Secondary | ICD-10-CM

## 2019-01-09 DIAGNOSIS — N870 Mild cervical dysplasia: Secondary | ICD-10-CM | POA: Diagnosis present

## 2019-01-09 DIAGNOSIS — Z3689 Encounter for other specified antenatal screening: Secondary | ICD-10-CM

## 2019-01-09 LAB — CBC
HCT: 41.3 % (ref 36.0–46.0)
Hemoglobin: 14 g/dL (ref 12.0–15.0)
MCH: 27.5 pg (ref 26.0–34.0)
MCHC: 33.9 g/dL (ref 30.0–36.0)
MCV: 81 fL (ref 80.0–100.0)
Platelets: 185 10*3/uL (ref 150–400)
RBC: 5.1 MIL/uL (ref 3.87–5.11)
RDW: 13.7 % (ref 11.5–15.5)
WBC: 11.7 10*3/uL — ABNORMAL HIGH (ref 4.0–10.5)
nRBC: 0 % (ref 0.0–0.2)

## 2019-01-09 LAB — COMPREHENSIVE METABOLIC PANEL
ALT: 14 U/L (ref 0–44)
AST: 27 U/L (ref 15–41)
Albumin: 2.9 g/dL — ABNORMAL LOW (ref 3.5–5.0)
Alkaline Phosphatase: 257 U/L — ABNORMAL HIGH (ref 38–126)
Anion gap: 14 (ref 5–15)
BUN: <5 mg/dL — ABNORMAL LOW (ref 6–20)
CO2: 17 mmol/L — ABNORMAL LOW (ref 22–32)
Calcium: 8.8 mg/dL — ABNORMAL LOW (ref 8.9–10.3)
Chloride: 107 mmol/L (ref 98–111)
Creatinine, Ser: 0.83 mg/dL (ref 0.44–1.00)
GFR calc Af Amer: >60 mL/min (ref >60)
GFR calc non Af Amer: >60 mL/min (ref >60)
Glucose, Bld: 95 mg/dL (ref 70–99)
Potassium: 3.2 mmol/L — ABNORMAL LOW (ref 3.5–5.1)
Sodium: 138 mmol/L (ref 135–145)
Total Bilirubin: 0.5 mg/dL (ref 0.3–1.2)
Total Protein: 6.2 g/dL — ABNORMAL LOW (ref 6.5–8.1)

## 2019-01-09 LAB — SARS CORONAVIRUS 2 BY RT PCR (HOSPITAL ORDER, PERFORMED IN ~~LOC~~ HOSPITAL LAB): SARS Coronavirus 2: POSITIVE — AB

## 2019-01-09 MED ORDER — SODIUM CHLORIDE 0.9 % IV SOLN
5.0000 10*6.[IU] | Freq: Once | INTRAVENOUS | Status: AC
  Administered 2019-01-09: 5 10*6.[IU] via INTRAVENOUS
  Filled 2019-01-09: qty 5

## 2019-01-09 MED ORDER — FENTANYL-BUPIVACAINE-NACL 0.5-0.125-0.9 MG/250ML-% EP SOLN: 12.0000 mL/h | EPIDURAL | Status: DC | PRN

## 2019-01-09 MED ORDER — LIDOCAINE HCL (PF) 1 % IJ SOLN
INTRAMUSCULAR | Status: DC | PRN
  Administered 2019-01-09 (×2): 5 mL via EPIDURAL

## 2019-01-09 MED ORDER — FENTANYL CITRATE (PF) 100 MCG/2ML IJ SOLN
100.0000 ug | INTRAMUSCULAR | Status: DC | PRN
  Administered 2019-01-09: 100 ug via INTRAVENOUS
  Filled 2019-01-09: qty 2

## 2019-01-09 MED ORDER — PHENYLEPHRINE 40 MCG/ML (10ML) SYRINGE FOR IV PUSH (FOR BLOOD PRESSURE SUPPORT)
80.0000 ug | PREFILLED_SYRINGE | INTRAVENOUS | Status: DC | PRN
  Filled 2019-01-09: qty 10

## 2019-01-09 MED ORDER — LACTATED RINGERS IV SOLN: 500.0000 mL | INTRAVENOUS | Status: DC | PRN

## 2019-01-09 MED ORDER — EPHEDRINE 5 MG/ML INJ
10.0000 mg | INTRAVENOUS | Status: DC | PRN
  Filled 2019-01-09: qty 2

## 2019-01-09 MED ORDER — LIDOCAINE HCL (PF) 1 % IJ SOLN: 30.0000 mL | INTRAMUSCULAR | Status: DC | PRN

## 2019-01-09 MED ORDER — SODIUM CHLORIDE (PF) 0.9 % IJ SOLN
INTRAMUSCULAR | Status: DC | PRN
  Administered 2019-01-09: 12 mL/h via EPIDURAL

## 2019-01-09 MED ORDER — DIPHENHYDRAMINE HCL 50 MG/ML IJ SOLN: 12.5000 mg | INTRAMUSCULAR | Status: DC | PRN

## 2019-01-09 MED ORDER — LACTATED RINGERS IV SOLN
500.0000 mL | Freq: Once | INTRAVENOUS | Status: AC
  Administered 2019-01-09: 20:00:00 500 mL via INTRAVENOUS

## 2019-01-09 MED ORDER — PENICILLIN G 3 MILLION UNITS IVPB - SIMPLE MED
3.0000 10*6.[IU] | INTRAVENOUS | Status: DC
  Filled 2019-01-09 (×3): qty 100

## 2019-01-09 MED ORDER — LACTATED RINGERS IV SOLN: INTRAVENOUS | Status: DC

## 2019-01-09 MED ORDER — HYDROXYZINE HCL 50 MG PO TABS
50.0000 mg | ORAL_TABLET | Freq: Four times a day (QID) | ORAL | Status: DC | PRN
  Filled 2019-01-09: qty 1

## 2019-01-09 MED ORDER — OXYTOCIN 40 UNITS IN NORMAL SALINE INFUSION - SIMPLE MED
2.5000 [IU]/h | INTRAVENOUS | Status: DC
  Administered 2019-01-10: 2.5 [IU]/h via INTRAVENOUS
  Filled 2019-01-09: qty 1000

## 2019-01-09 MED ORDER — OXYTOCIN BOLUS FROM INFUSION
500.0000 mL | Freq: Once | INTRAVENOUS | Status: AC
  Administered 2019-01-10: 500 mL via INTRAVENOUS

## 2019-01-09 MED ORDER — ACETAMINOPHEN 325 MG PO TABS: 650.0000 mg | ORAL_TABLET | ORAL | Status: DC | PRN

## 2019-01-09 MED ORDER — FENTANYL-BUPIVACAINE-NACL 0.5-0.125-0.9 MG/250ML-% EP SOLN
12.0000 mL/h | EPIDURAL | Status: DC | PRN
  Filled 2019-01-09: qty 250

## 2019-01-09 MED ORDER — ONDANSETRON HCL 4 MG/2ML IJ SOLN: 4.0000 mg | Freq: Four times a day (QID) | INTRAMUSCULAR | Status: DC | PRN

## 2019-01-09 NOTE — Anesthesia Preprocedure Evaluation (Signed)

## 2019-01-09 NOTE — MAU Note (Signed)
CTX 5 mins apart.  3 cm in the office today.  Reports feeling a gush come out about 5 mins ago in the lobby.  No VB.  + FM.

## 2019-01-09 NOTE — H&P (Signed)
LABOR AND DELIVERY ADMISSION HISTORY AND PHYSICAL NOTE  Angelica Gilbert is a 23 y.o. female G1P0000 with IUP at 4334w2d by LMP c/w 6 week sono presenting for SOL.  She reports positive fetal movement. She denies leakage of fluid or vaginal bleeding.  Prenatal History/Complications: PNC at [facility] Pregnancy complications:  - elevated BPs intrapartum; pain likely contributing given no history of elevated BPs and elevation occurring during active labor; will obtain [facility] labs  -Rh negative  -sickle cell trait  -ASCUS with +high risk HPV   Past Medical History: Past Medical History:  Diagnosis Date  . Medical history non-contributory     Past Surgical History: Past Surgical History:  Procedure Laterality Date  . WISDOM TOOTH EXTRACTION      Obstetrical History: OB History    Gravida  1   Para  0   Term  0   Preterm  0   AB  0   Living  0     SAB  0   TAB  0   Ectopic  0   Multiple  0   Live Births  0           Social History: Social History   Socioeconomic History  . Marital status: Single    Spouse name: Not on file  . Number of children: Not on file  . Years of education: Not on file  . Highest education level: Not on file  Occupational History  . Not on file  Social Needs  . Financial resource strain: Not on file  . Food insecurity    Worry: Not on file    Inability: Not on file  . Transportation needs    Medical: Not on file    Non-medical: Not on file  Tobacco Use  . Smoking status: Never Smoker  . Smokeless tobacco: Never Used  Substance and Sexual Activity  . Alcohol use: Never    Frequency: Never  . Drug use: Never  . Sexual activity: Yes    Birth control/protection: None  Lifestyle  . Physical activity    Days per week: Not on file    Minutes per session: Not on file  . Stress: Not on file  Relationships  . Social Musicianconnections    Talks on phone: Not on file    Gets together: Not on file    Attends religious service: Not on file     Active member of club or organization: Not on file    Attends meetings of clubs or organizations: Not on file    Relationship status: Not on file  Other Topics Concern  . Not on file  Social History Narrative  . Not on file    Family History: Family History  Problem Relation Age of Onset  . Diabetes Father     Allergies: No Known Allergies  Medications Prior to Admission  Medication Sig Dispense Refill Last Dose  . cyclobenzaprine (FLEXERIL) 5 MG tablet Take 1 tablet (5 mg total) by mouth 3 (three) times daily as needed for muscle spasms. 10 tablet 0   . Prenat w/o A Vit-FeFum-FePo-FA (FOLIVANE-OB) 130-92.4-1 MG CAPS Take 1 tablet by mouth daily. 30 capsule 10      Review of Systems  All systems reviewed and negative except as stated in HPI  Physical Exam Blood pressure 114/63, pulse (!) 103, temperature 99 F (37.2 C), resp. rate 17, height 5\' 7"  (1.702 m), weight 72.8 kg, last menstrual period 04/16/2018, SpO2 100 %. General appearance: alert, oriented,  NAD Lungs: normal respiratory effort Heart: regular rate Abdomen: soft, non-tender; gravid, FH appropriate for GA Extremities: No calf swelling or tenderness Presentation: cephalic Fetal monitoring: 150 bpm, moderate variability, +acels, variable decels  Uterine activity: q1-4 min  Dilation: 8 Effacement (%): 100 Station: Plus 1 Exam by:: A. Durene Romans, RN  Prenatal labs: ABO, Rh: --/--/PENDING (07/20 2013) Antibody: PENDING (07/20 2013) Rubella: 8.36 (12/12 1435) RPR: Non Reactive (05/07 0937)  HBsAg: Negative (12/12 1435)  HIV: Non Reactive (05/07 0937)  GC/Chlamydia: Negative  GBS:   Positive  2-hr GTT: Normal  Genetic screening:  Negative  Anatomy US: Normal with limited views of spine; spine normal on repeat sono   Prenatal Transfer Tool  Maternal Diabetes: No Genetic Screening: Normal Maternal Ultrasounds/Referrals: Normal Fetal Ultrasounds or other Referrals:  None Maternal Substance Abuse:   No Significant Maternal Medications:  None Significant Maternal Lab Results: Group B Strep positive and Rh negative  Results for orders placed or performed during the hospital encounter of 01/09/19 (from the past 24 hour(s))  CBC   Collection Time: 01/09/19  7:37 PM  Result Value Ref Range   WBC 11.7 (H) 4.0 - 10.5 K/uL   RBC 5.10 3.87 - 5.11 MIL/uL   Hemoglobin 14.0 12.0 - 15.0 g/dL   HCT 41.3 36.0 - 46.0 %   MCV 81.0 80.0 - 100.0 fL   MCH 27.5 26.0 - 34.0 pg   MCHC 33.9 30.0 - 36.0 g/dL   RDW 13.7 11.5 - 15.5 %   Platelets 185 150 - 400 K/uL   nRBC 0.0 0.0 - 0.2 %  Type and screen   Collection Time: 01/09/19  8:13 PM  Result Value Ref Range   ABO/RH(D) PENDING    Antibody Screen PENDING    Sample Expiration      01/12/2019,2359 Performed at [facility], [place] [address], [place], [place] [address]     Patient Active Problem List   Diagnosis Date Noted  . Indication for care in labor or delivery 01/09/2019  . Positive GBS test 12/26/2018  . Rh negative state in antepartum period 10/27/2018  . Sickle cell trait in mother affecting pregnancy ([place]) 06/10/2018  . ASCUS with positive high risk HPV cervical 06/09/2018  . Supervision of normal first pregnancy, antepartum 06/02/2018    Assessment: Angelica Gilbert is a 23 y.o. G1P0000 at [redacted]w[redacted]d here for SOL. Elevated BPs noted intrapartum during active labor. No prior history of elevated BPs, pain likely contributing. Will continue to monitor and obtain [place] labs. Patient asymptomatic.   #Labor: Expectant management. Patient progressing.  #Pain: Epidural upon maternal request  #FWB: Cat II but overall reassuring with moderate variability and +acels  #ID:  GBS+, PCN ordered  #MOF: Bottle  #MOC:Undecided    Melina Schools 01/09/2019, 8:41 PM

## 2019-01-09 NOTE — Anesthesia Procedure Notes (Signed)
Epidural Patient location during procedure: OB  Staffing Anesthesiologist: Quin Mathenia, MD Performed: anesthesiologist   Preanesthetic Checklist Completed: patient identified, site marked, surgical consent, pre-op evaluation, timeout performed, IV checked, risks and benefits discussed and monitors and equipment checked  Epidural Patient position: sitting Prep: DuraPrep Patient monitoring: heart rate, continuous pulse ox and blood pressure Approach: right paramedian Location: L4-L5 Injection technique: LOR saline  Needle:  Needle type: Tuohy  Needle gauge: 17 G Needle length: 9 cm and 9 Needle insertion depth: 5 cm Catheter type: closed end flexible Catheter size: 20 Guage Catheter at skin depth: 9 cm Test dose: negative  Assessment Events: blood not aspirated, injection not painful, no injection resistance, negative IV test and no paresthesia  Additional Notes Patient identified. Risks/Benefits/Options discussed with patient including but not limited to bleeding, infection, nerve damage, paralysis, failed block, incomplete pain control, headache, blood pressure changes, nausea, vomiting, reactions to medication both or allergic, itching and postpartum back pain. Confirmed with bedside nurse the patient's most recent platelet count. Confirmed with patient that they are not currently taking any anticoagulation, have any bleeding history or any family history of bleeding disorders. Patient expressed understanding and wished to proceed. All questions were answered. Sterile technique was used throughout the entire procedure. Please see nursing notes for vital signs. Test dose was given through epidural needle and negative prior to continuing to dose epidural or start infusion. Warning signs of high block given to the patient including shortness of breath, tingling/numbness in hands, complete motor block, or any concerning symptoms with instructions to call for help. Patient was given  instructions on fall risk and not to get out of bed. All questions and concerns addressed with instructions to call with any issues.     

## 2019-01-09 NOTE — Progress Notes (Signed)
   PRENATAL VISIT NOTE  Subjective:  Angelica Gilbert is a 23 y.o. G1P0000 at [redacted]w[redacted]d being seen today for ongoing prenatal care.  She is currently monitored for the following issues for this low-risk pregnancy and has Supervision of normal first pregnancy, antepartum; ASCUS with positive high risk HPV cervical; Sickle cell trait in mother affecting pregnancy ([place]); Rh negative state in antepartum period; and Positive GBS test on their problem list.  Patient reports contractions since 2 days prev . Pt thinks she may have ruptured. She had a gush of fluid upon rising this am. NO further fluid after emptying her her bladder.      Contractions: Irregular. Vag. Bleeding: None.  Movement: Present. Denies leaking of fluid.   The following portions of the patient's history were reviewed and updated as appropriate: allergies, current medications, past family history, past medical history, past social history, past surgical history and problem list.   Objective:   Vitals:   01/09/19 1002  BP: 113/73  Pulse: (!) 107  Weight: 160 lb 1.3 oz (72.6 kg)    Fetal Status: Fetal Heart Rate (bpm): 144   Movement: Present     General:  Alert, oriented and cooperative. Patient is in no acute distress.  Skin: Skin is warm and dry. No rash noted.   Cardiovascular: Normal heart rate noted  Respiratory: Normal respiratory effort, no problems with respiration noted  Abdomen: Soft, gravid, appropriate for gestational age.  Pain/Pressure: Present     Pelvic: Cervical exam performed        Extremities: Normal range of motion.  Edema: Trace  Mental Status: Normal mood and affect. Normal behavior. Normal judgment and thought content.   Assessment and Plan:  Pregnancy: G1P0000 at [redacted]w[redacted]d 1. Supervision of normal first pregnancy, antepartum Latent phase labor No ROM   2. Rh negative state in antepartum period S/p Rhogam  3. Positive GBS test Needs atbx in labor  4. ASCUS with positive high risk HPV cervical Needs  PAP in 1 year  5. Sickle cell trait in mother affecting pregnancy [facility])   Term labor symptoms and general obstetric precautions including but not limited to vaginal bleeding, contractions, leaking of fluid and fetal movement were reviewed in detail with the patient. Please refer to After Visit Summary for other counseling recommendations.   No follow-ups on file.  Future Appointments  Date Time Provider Department Center  01/12/2019  8:45 AM Truett Mainland, DO [facility] None    Lavonia Drafts, MD

## 2019-01-09 NOTE — MAU Provider Note (Signed)
Angelica Gilbert is a G1P0000 at [redacted]w[redacted]d seen in MAU for labor. RN labor check, seen by provider for cervical check per request of RN.  Dilation: 6 Effacement (%): 100 Station: Plus 1 Presentation: Vertex Exam by:: Sunday Corn, CNM   NST - FHR: 120 bpm / moderate variability / accels present / decels absent / TOCO: regular every 1-4 mins   Assessment: Positive GBS test Indication for care in labor or delivery   Plan: Admit to L&D  Routine orders COVID-19 stat testing Pt may have epidural Orders placed in Epic Labor team called by RN  Laury Deep, CNM  01/09/2019 7:36 PM

## 2019-01-09 NOTE — Progress Notes (Signed)
RN continously at bedside adjusting FHR monitor. Difficulty tracing FHR d/t maternal movement and discomfort. FHR between 135-180 when adjusted.

## 2019-01-10 ENCOUNTER — Encounter (HOSPITAL_COMMUNITY): Payer: Self-pay

## 2019-01-10 DIAGNOSIS — U071 COVID-19: Secondary | ICD-10-CM

## 2019-01-10 DIAGNOSIS — Z3A38 38 weeks gestation of pregnancy: Secondary | ICD-10-CM

## 2019-01-10 DIAGNOSIS — O99824 Streptococcus B carrier state complicating childbirth: Secondary | ICD-10-CM

## 2019-01-10 LAB — RPR: RPR Ser Ql: NONREACTIVE

## 2019-01-10 MED ORDER — SIMETHICONE 80 MG PO CHEW: 80.0000 mg | CHEWABLE_TABLET | ORAL | Status: DC | PRN

## 2019-01-10 MED ORDER — BENZOCAINE-MENTHOL 20-0.5 % EX AERO: 1.0000 "application " | INHALATION_SPRAY | CUTANEOUS | Status: DC | PRN

## 2019-01-10 MED ORDER — OXYTOCIN 40 UNITS IN NORMAL SALINE INFUSION - SIMPLE MED
1.0000 m[IU]/min | INTRAVENOUS | Status: DC
  Administered 2019-01-10: 2 m[IU]/min via INTRAVENOUS

## 2019-01-10 MED ORDER — TETANUS-DIPHTH-ACELL PERTUSSIS 5-2.5-18.5 LF-MCG/0.5 IM SUSP: 0.5000 mL | Freq: Once | INTRAMUSCULAR | Status: DC

## 2019-01-10 MED ORDER — DIBUCAINE (PERIANAL) 1 % EX OINT: 1.0000 "application " | TOPICAL_OINTMENT | CUTANEOUS | Status: DC | PRN

## 2019-01-10 MED ORDER — PRENATAL MULTIVITAMIN CH
1.0000 | ORAL_TABLET | Freq: Every day | ORAL | Status: DC
  Administered 2019-01-11: 1 via ORAL
  Filled 2019-01-10 (×2): qty 1

## 2019-01-10 MED ORDER — ACETAMINOPHEN 325 MG PO TABS: 650.0000 mg | ORAL_TABLET | ORAL | Status: DC | PRN

## 2019-01-10 MED ORDER — RHO D IMMUNE GLOBULIN 1500 UNIT/2ML IJ SOSY
300.0000 ug | PREFILLED_SYRINGE | Freq: Once | INTRAMUSCULAR | Status: AC
  Administered 2019-01-10: 300 ug via INTRAVENOUS
  Filled 2019-01-10: qty 2

## 2019-01-10 MED ORDER — ONDANSETRON HCL 4 MG/2ML IJ SOLN: 4.0000 mg | INTRAMUSCULAR | Status: DC | PRN

## 2019-01-10 MED ORDER — SENNOSIDES-DOCUSATE SODIUM 8.6-50 MG PO TABS
2.0000 | ORAL_TABLET | ORAL | Status: DC
  Administered 2019-01-10: 2 via ORAL
  Filled 2019-01-10: qty 2

## 2019-01-10 MED ORDER — ZOLPIDEM TARTRATE 5 MG PO TABS: 5.0000 mg | ORAL_TABLET | Freq: Every evening | ORAL | Status: DC | PRN

## 2019-01-10 MED ORDER — MEASLES, MUMPS & RUBELLA VAC IJ SOLR: 0.5000 mL | Freq: Once | INTRAMUSCULAR | Status: DC

## 2019-01-10 MED ORDER — WITCH HAZEL-GLYCERIN EX PADS: 1.0000 "application " | MEDICATED_PAD | CUTANEOUS | Status: DC | PRN

## 2019-01-10 MED ORDER — DIPHENHYDRAMINE HCL 25 MG PO CAPS: 25.0000 mg | ORAL_CAPSULE | Freq: Four times a day (QID) | ORAL | Status: DC | PRN

## 2019-01-10 MED ORDER — COCONUT OIL OIL: 1.0000 "application " | TOPICAL_OIL | Status: DC | PRN

## 2019-01-10 MED ORDER — TERBUTALINE SULFATE 1 MG/ML IJ SOLN: 0.2500 mg | Freq: Once | INTRAMUSCULAR | Status: DC | PRN

## 2019-01-10 MED ORDER — ONDANSETRON HCL 4 MG PO TABS: 4.0000 mg | ORAL_TABLET | ORAL | Status: DC | PRN

## 2019-01-10 MED ORDER — IBUPROFEN 600 MG PO TABS
600.0000 mg | ORAL_TABLET | Freq: Four times a day (QID) | ORAL | Status: DC
  Administered 2019-01-10 – 2019-01-11 (×5): 600 mg via ORAL
  Filled 2019-01-10 (×6): qty 1

## 2019-01-10 NOTE — Progress Notes (Signed)
LABOR PROGRESS NOTE  Angelica Gilbert is a 23 y.o. G1P0000 at [redacted]w[redacted]d  admitted for SOL.   Subjective: At patient's bedside for approximately 25 minutes to observe pushing. She feels some pressure in bottom but is unable to feel contractions.   Objective: BP 132/82   Pulse (!) 120   Temp 98 F (36.7 C) (Oral)   Resp 18   Ht 5\' 7"  (1.702 m)   Wt 72.8 kg   LMP 04/16/2018 (Exact Date)   SpO2 100%   BMI 25.15 kg/m  or  Vitals:   01/09/19 2231 01/09/19 2309 01/09/19 2330 01/10/19 0001  BP: 126/76 123/60 138/74 132/82  Pulse: (!) 116 (!) 114 (!) 131 (!) 120  Resp: 18 18 16 18   Temp:      TempSrc:      SpO2:      Weight:      Height:        Dilation: 10 Dilation Complete Date: 01/09/19 Dilation Complete Time: 2327 Effacement (%): 100 Station: Plus 1 Presentation: Vertex Exam by:: Doree Barthel, RN FHT: baseline rate 120, moderate varibility, +acel, variable decel Toco: q4-6 min   Labs: Lab Results  Component Value Date   WBC 11.7 (H) 01/09/2019   HGB 14.0 01/09/2019   HCT 41.3 01/09/2019   MCV 81.0 01/09/2019   PLT 185 01/09/2019    Patient Active Problem List   Diagnosis Date Noted  . Indication for care in labor or delivery 01/09/2019  . Positive GBS test 12/26/2018  . Rh negative state in antepartum period 10/27/2018  . Sickle cell trait in mother affecting pregnancy ([place]) 06/10/2018  . ASCUS with positive high risk HPV cervical 06/09/2018  . Supervision of normal first pregnancy, antepartum 06/02/2018    Assessment / Plan: 23 y.o. G1P0000 at [redacted]w[redacted]d here for SOL. Patient is asymptomatic COVID+.  Labor: Patient pushing with slow descent of fetal head. Contractions spaced out so will start Pitocin 2x2 in an attempt to achieve more consistent contraction pattern.  Fetal Wellbeing:  Cat II with variable decels but overall reassuring with moderate variability, +acels, and quick return to baseline  Pain Control:  Epidural in place  Anticipated MOD:  NSVD    Phill Myron, D.O. OB Fellow  01/10/2019, 12:55 AM

## 2019-01-10 NOTE — Discharge Summary (Signed)
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 yo G1P0000 at [redacted]w[redacted]d was admitted in Active Labor on 01/09/2019. Patient had an uncomplicated labor course as follows:  Membrane Rupture Time/Date:  ,   Intrapartum Procedures: Episiotomy: None [1]                                         Lacerations:  Periurethral [8]  Patient had a delivery of a Viable infant. 01/10/2019  Information for the patient's newborn:  Angelica, Gilbert [[id number]]  Delivery Method: Vag-Spont     Pateint had an uncomplicated postpartum course.  She is ambulating, tolerating a regular diet, passing flatus, and urinating well. Patient is discharged home in stable condition on 01/11/19.   Physical exam  Vitals:   01/10/19 1756 01/10/19 2000 01/11/19 0623 01/11/19 0800  BP: 118/74 112/73 119/70 116/79  Pulse: 86 77 74 84  Resp: 18 16 17 18   Temp: 98.6 F (37 C) 98.2 F (36.8 C) 98 F (36.7 C) 98.2 F (36.8 C)  TempSrc: Oral Oral Oral Oral  SpO2: 100%  100% 99%  Weight:      Height:       General: alert, cooperative and no distress Lochia: appropriate Uterine Fundus: firm Incision:  N/A DVT Evaluation: No evidence of DVT seen on physical exam. Labs: Lab Results  Component Value Date   WBC 11.7 (H) 01/09/2019   HGB 14.0 01/09/2019   HCT 41.3 01/09/2019   MCV 81.0 01/09/2019   PLT 185 01/09/2019   CMP Latest Ref Rng & Units 01/09/2019  Glucose 70 - 99 mg/dL 95  BUN 6 - 20 mg/dL <5(L)  Creatinine 0.44 - 1.00 mg/dL 0.83  Sodium 135 - 145 mmol/L 138  Potassium 3.5 - 5.1 mmol/L 3.2(L)  Chloride 98 - 111 mmol/L 107  CO2 22 - 32 mmol/L 17(L)  Calcium 8.9 - 10.3 mg/dL 8.8(L)  Total Protein 6.5 - 8.1 g/dL 6.2(L)  Total Bilirubin 0.3 - 1.2 mg/dL 0.5  Alkaline Phos 38 - 126 U/L 257(H)  AST 15 - 41 U/L 27  ALT  0 - 44 U/L 14    Discharge instruction: per After Visit Summary and "Baby and Me Booklet".  After visit meds:  Allergies as of 01/11/2019   No Known Allergies     Medication List    STOP taking these medications   cyclobenzaprine 5 MG tablet Commonly known as: FLEXERIL     TAKE these medications   Folivane-OB 130-92.4-1 MG Caps Take 1 tablet by mouth daily.   ibuprofen 600 MG tablet Commonly known as: ADVIL Take 1 tablet (600 mg total) by mouth every 6 (six) hours.       Diet: routine diet  Activity: Advance as tolerated. Pelvic rest for 6 weeks.   Outpatient follow up:4 weeks Follow up Appt: No future appointments. Follow up Visit:No follow-ups on file.  Please schedule this patient for Postpartum visit in: 4 weeks with the following provider: Any provider For C/S patients schedule nurse incision check in weeks 2 weeks: no Low risk pregnancy complicated by: [facility] neg, [facility]  Delivery mode:  SVD Anticipated Birth Control:  other/unsure PP Procedures needed: None   Schedule Integrated BH visit: no  Postpartum contraception: None  Newborn Data: Live born female Weight: 3250 g Weight:, English: 7 lb 2.6 oz Birth Weight:   APGAR: 9, 9  Newborn Delivery   Birth date/time: 01/10/2019 01:57:00 Delivery type: Vaginal,  Spontaneous      Baby Feeding: Bottle Disposition:home with mother Home isolation instruction given  .td Adam PhenixArnold, Fortino Haag G, MD

## 2019-01-11 LAB — RH IG WORKUP (INCLUDES ABO/RH)
ABO/RH(D): A NEG
Fetal Screen: NEGATIVE
Gestational Age(Wks): 38
Unit division: 0

## 2019-01-11 MED ORDER — IBUPROFEN 600 MG PO TABS: 600.0000 mg | ORAL_TABLET | Freq: Four times a day (QID) | ORAL | 0 refills | Status: DC

## 2019-01-11 NOTE — Discharge Instructions (Signed)
Person Under Monitoring Name: Angelica Gilbert  Location: [place] [address]   Infection Prevention Recommendations for Individuals Confirmed to have, or Being Evaluated for, 2019 Novel Coronavirus (COVID-19) Infection Who Receive Care at Home  Individuals who are confirmed to have, or are being evaluated for, COVID-19 should follow the prevention steps below until a healthcare provider or local or state health department says they can return to normal activities.  Stay home except to get medical care You should restrict activities outside your home, except for getting medical care. Do not go to work, school, or public areas, and do not use public transportation or taxis.  Call ahead before visiting your doctor Before your medical appointment, call the healthcare provider and tell them that you have, or are being evaluated for, COVID-19 infection. This will help the healthcare providers office take steps to keep other people from getting infected. Ask your healthcare provider to call the local or state health department.  Monitor your symptoms Seek prompt medical attention if your illness is worsening (e.g., difficulty breathing). Before going to your medical appointment, call the healthcare provider and tell them that you have, or are being evaluated for, COVID-19 infection. Ask your healthcare provider to call the local or state health department.  Wear a facemask You should wear a facemask that covers your nose and mouth when you are in the same room with other people and when you visit a healthcare provider. People who live with or visit you should also wear a facemask while they are in the same room with you.  Separate yourself from other people in your home As much as possible, you should stay in a different room from other people in your home. Also, you should use a separate bathroom, if available.  Avoid sharing household items You should not share  dishes, drinking glasses, cups, eating utensils, towels, bedding, or other items with other people in your home. After using these items, you should wash them thoroughly with soap and water.  Cover your coughs and sneezes Cover your mouth and nose with a tissue when you cough or sneeze, or you can cough or sneeze into your sleeve. Throw used tissues in a lined trash can, and immediately wash your hands with soap and water for at least 20 seconds or use an alcohol-based hand rub.  Wash your [facility] your hands often and thoroughly with soap and water for at least 20 seconds. You can use an alcohol-based hand sanitizer if soap and water are not available and if your hands are not visibly dirty. Avoid touching your eyes, nose, and mouth with unwashed hands.   Prevention Steps for Caregivers and Household Members of Individuals Confirmed to have, or Being Evaluated for, COVID-19 Infection Being Cared for in the Home  If you live with, or provide care at home for, a person confirmed to have, or being evaluated for, COVID-19 infection please follow these guidelines to prevent infection:  Follow healthcare providers instructions Make sure that you understand and can help the patient follow any healthcare provider instructions for all care.  Provide for the patients basic needs You should help the patient with basic needs in the home and provide support for getting groceries, prescriptions, and other personal needs.  Monitor the patients symptoms If they are getting sicker, call his or her medical provider and tell them that the patient has, or is being evaluated for, COVID-19 infection. This will help the healthcare providers office  take steps to keep other people from getting infected. Ask the healthcare provider to call the local or state health department.  Limit the number of people who have contact with the patient  If possible, have only one caregiver for the patient.  Other  household members should stay in another home or place of residence. If this is not possible, they should stay  in another room, or be separated from the patient as much as possible. Use a separate bathroom, if available.  Restrict visitors who do not have an essential need to be in the home.  Keep older adults, very young children, and other sick people away from the patient Keep older adults, very young children, and those who have compromised immune systems or chronic health conditions away from the patient. This includes people with chronic heart, lung, or kidney conditions, diabetes, and cancer.  Ensure good ventilation Make sure that shared spaces in the home have good air flow, such as from an air conditioner or an opened window, weather permitting.  Wash your hands often  Wash your hands often and thoroughly with soap and water for at least 20 seconds. You can use an alcohol based hand sanitizer if soap and water are not available and if your hands are not visibly dirty.  Avoid touching your eyes, nose, and mouth with unwashed hands.  Use disposable paper towels to dry your hands. If not available, use dedicated cloth towels and replace them when they become wet.  Wear a facemask and gloves  Wear a disposable facemask at all times in the room and gloves when you touch or have contact with the patients blood, body fluids, and/or secretions or excretions, such as sweat, saliva, sputum, nasal mucus, vomit, urine, or feces.  Ensure the mask fits over your nose and mouth tightly, and do not touch it during use.  Throw out disposable facemasks and gloves after using them. Do not reuse.  Wash your hands immediately after removing your facemask and gloves.  If your personal clothing becomes contaminated, carefully remove clothing and launder. Wash your hands after handling contaminated clothing.  Place all used disposable facemasks, gloves, and other waste in a lined container before  disposing them with other household waste.  Remove gloves and wash your hands immediately after handling these items.  Do not share dishes, glasses, or other household items with the patient  Avoid sharing household items. You should not share dishes, drinking glasses, cups, eating utensils, towels, bedding, or other items with a patient who is confirmed to have, or being evaluated for, COVID-19 infection.  After the person uses these items, you should wash them thoroughly with soap and water.  Wash laundry thoroughly  Immediately remove and wash clothes or bedding that have blood, body fluids, and/or secretions or excretions, such as sweat, saliva, sputum, nasal mucus, vomit, urine, or feces, on them.  Wear gloves when handling laundry from the patient.  Read and follow directions on labels of laundry or clothing items and detergent. In general, wash and dry with the warmest temperatures recommended on the label.  Clean all areas the individual has used often  Clean all touchable surfaces, such as counters, tabletops, doorknobs, bathroom fixtures, toilets, phones, keyboards, tablets, and bedside tables, every day. Also, clean any surfaces that may have blood, body fluids, and/or secretions or excretions on them.  Wear gloves when cleaning surfaces the patient has come in contact with.  Use a diluted bleach solution (e.g., dilute bleach with 1 part  bleach and 10 parts water) or a household disinfectant with a label that says EPA-registered for coronaviruses. To make a bleach solution at home, add 1 tablespoon of bleach to 1 quart (4 cups) of water. For a larger supply, add  cup of bleach to 1 gallon (16 cups) of water.  Read labels of cleaning products and follow recommendations provided on product labels. Labels contain instructions for safe and effective use of the cleaning product including precautions you should take when applying the product, such as wearing gloves or eye protection  and making sure you have good ventilation during use of the product.  Remove gloves and wash hands immediately after cleaning.  Monitor yourself for signs and symptoms of illness Caregivers and household members are considered close contacts, should monitor their health, and will be asked to limit movement outside of the home to the extent possible. Follow the monitoring steps for close contacts listed on the symptom monitoring form.   ? If you have additional questions, contact your local health department or call the epidemiologist on call at [phone] (available 24/7). ? This guidance is subject to change. For the most up-to-date guidance from [facility], please refer to their website: YouBlogs.pl

## 2019-01-11 NOTE — Anesthesia Postprocedure Evaluation (Signed)
Anesthesia Post Note  Patient: Angelica Gilbert  Procedure(s) Performed: AN AD [place]     Patient location during evaluation: Mother Baby Anesthesia Type: Epidural Level of consciousness: awake Pain management: satisfactory to patient Vital Signs Assessment: post-procedure vital signs reviewed and stable Respiratory status: spontaneous breathing Cardiovascular status: stable Anesthetic complications: no    Last Vitals:  Vitals:   01/10/19 2000 01/11/19 0623  BP: 112/73 119/70  Pulse: 77 74  Resp: 16 17  Temp: 36.8 C 36.7 C  SpO2:  100%    Last Pain:  Vitals:   01/11/19 0640  TempSrc:   PainSc: 5    Pain Goal: Patients Stated Pain Goal: 3 (01/11/19 0640)                 Casimer Lanius

## 2019-01-12 ENCOUNTER — Encounter: Payer: 59 | Admitting: Family Medicine

## 2019-01-12 LAB — BPAM RBC
Blood Product Expiration Date: 202007292359
Blood Product Expiration Date: 202007292359
Unit Type and Rh: 600
Unit Type and Rh: 600

## 2019-01-12 LAB — TYPE AND SCREEN
ABO/RH(D): A NEG
Antibody Screen: POSITIVE
Unit division: 0
Unit division: 0

## 2019-02-23 ENCOUNTER — Other Ambulatory Visit: Payer: Self-pay

## 2019-02-23 ENCOUNTER — Encounter: Payer: Self-pay | Admitting: Family Medicine

## 2019-02-23 ENCOUNTER — Ambulatory Visit (INDEPENDENT_AMBULATORY_CARE_PROVIDER_SITE_OTHER): Payer: 59 | Admitting: Family Medicine

## 2019-02-23 DIAGNOSIS — R87610 Atypical squamous cells of undetermined significance on cytologic smear of cervix (ASC-US): Secondary | ICD-10-CM

## 2019-02-23 DIAGNOSIS — R87810 Cervical high risk human papillomavirus (HPV) DNA test positive: Secondary | ICD-10-CM

## 2019-02-23 DIAGNOSIS — Z1389 Encounter for screening for other disorder: Secondary | ICD-10-CM | POA: Diagnosis not present

## 2019-02-23 MED ORDER — NORETHIN ACE-ETH ESTRAD-FE 1-20 MG-MCG(24) PO TABS: 1.0000 | ORAL_TABLET | Freq: Every day | ORAL | 3 refills | Status: DC

## 2019-02-23 NOTE — Progress Notes (Signed)
Subjective:     Angelica Gilbert is a 23 y.o. female who presents for a postpartum visit. She is 6 weeks postpartum following a spontaneous vaginal delivery. I have fully reviewed the prenatal and intrapartum course. The delivery was at 70 gestational weeks. Outcome: spontaneous vaginal delivery. Anesthesia: epidural. Postpartum course has been normal. Baby's course has been normal. Baby is feeding by Jerlyn Ly Start Soot[place] Bleeding no bleeding. Bowel function is normal. Bladder function is normal. Patient is not sexually active. Contraception method is OCP (estrogen/progesterone). Postpartum depression screening: negative.  The following portions of the patient's history were reviewed and updated as appropriate: allergies, current medications, past family history, past medical history, past social history, past surgical history and problem list.  Review of Systems Pertinent items are noted in HPI.   Objective:    LMP 04/16/2018 (Exact Date)   General:  alert, cooperative and no distress  Lungs: clear to auscultation bilaterally  Heart:  regular rate and rhythm, S1, S2 normal, no murmur, click, rub or gallop  Abdomen: soft, non-tender; bowel sounds normal; no masses,  no organomegaly        Assessment:     Normal postpartum exam. ASCUS HPV    Plan:    1. Contraception: OCP (estrogen/progesterone) 2. Follow up in: 3 months for PAP or as needed.

## 2019-06-01 IMAGING — US US MFM OB DETAIL +14 WK
1 series · 13 of 28 positions shown · non-contrast
Comparison: none

[Series 1: us mfm ob detail +14 wk · 86 acquisitions, 13 frames shown]
[im 4/86]
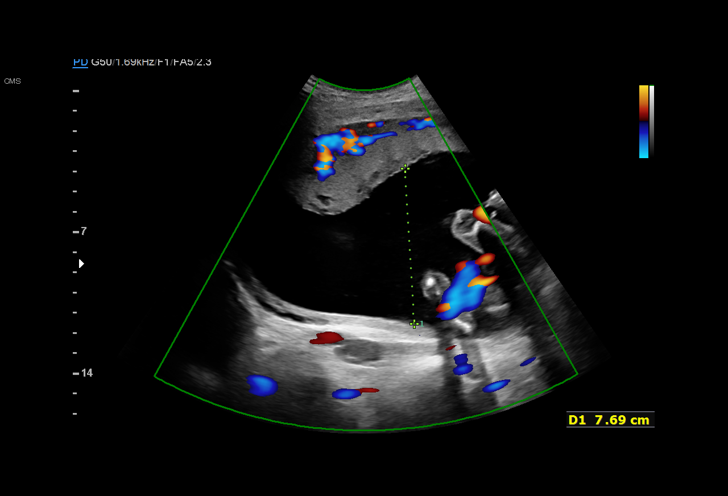
[im 10/86]
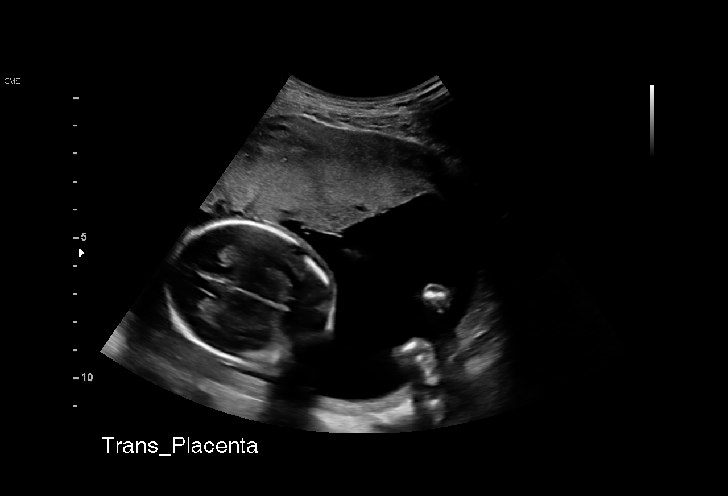
[im 16/86]
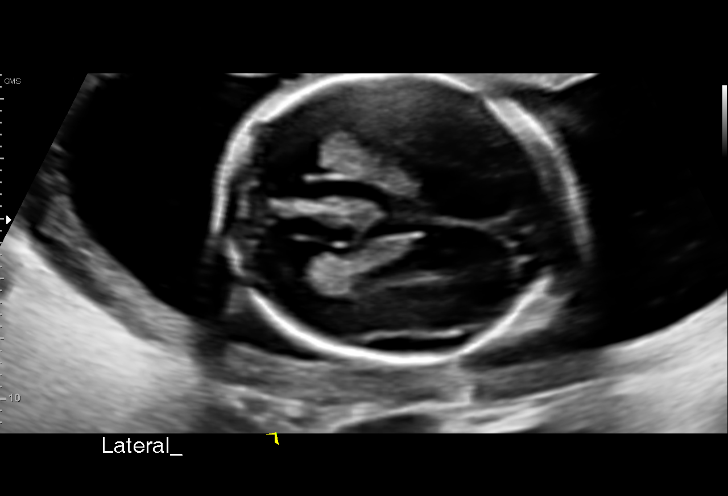
[im 23/86]
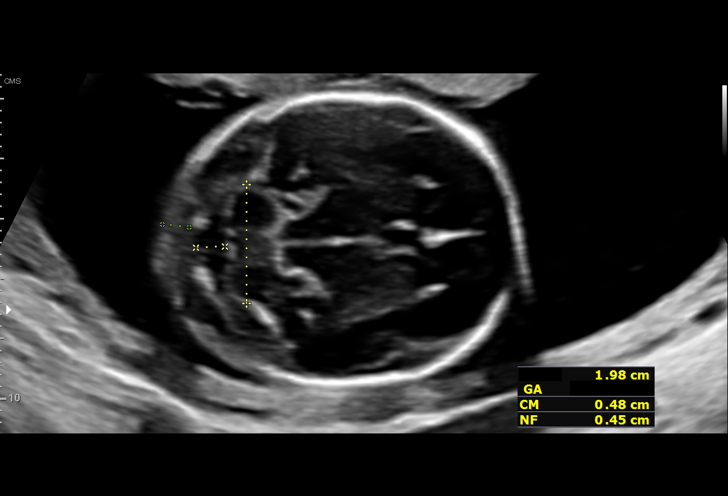
[im 29/86]
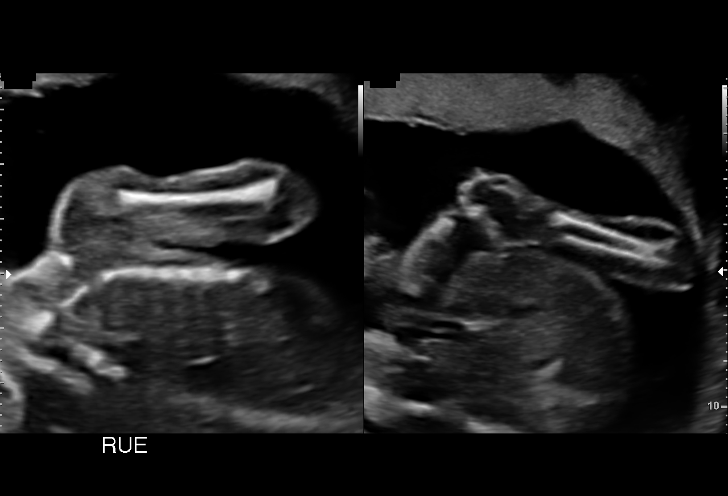
[im 35/86]
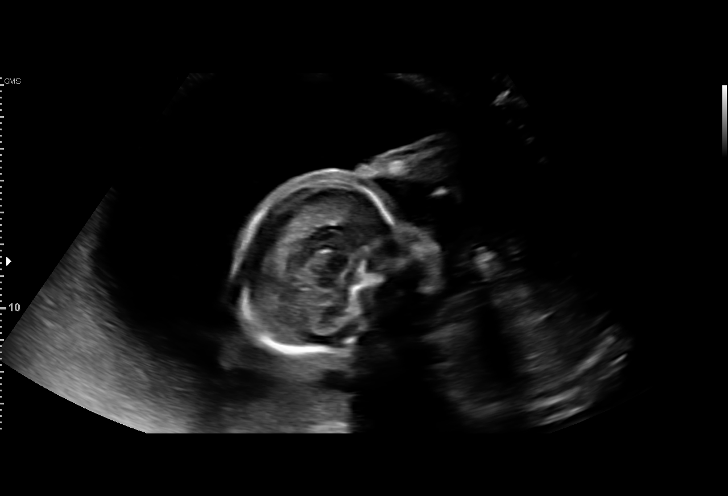
[im 45/86]
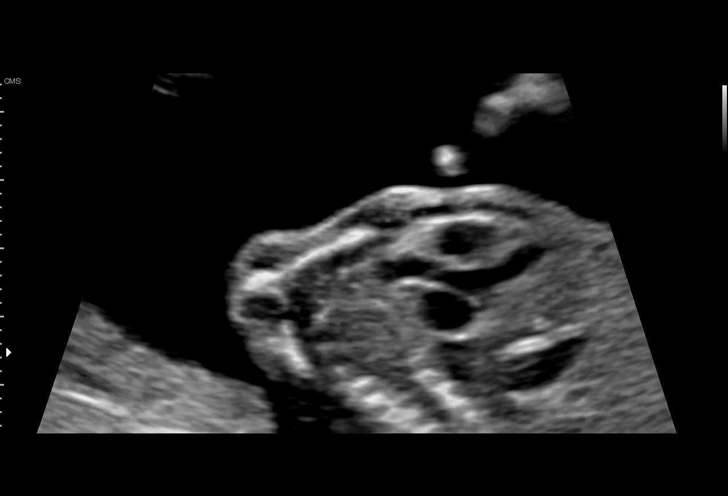
[im 51/86]
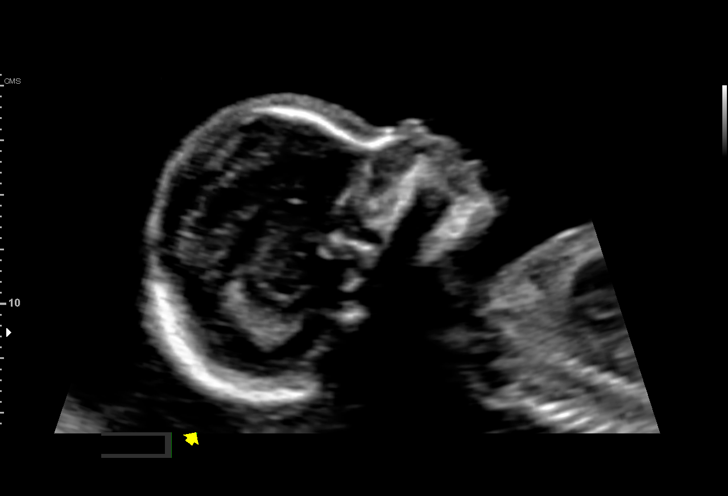
[im 57/86]
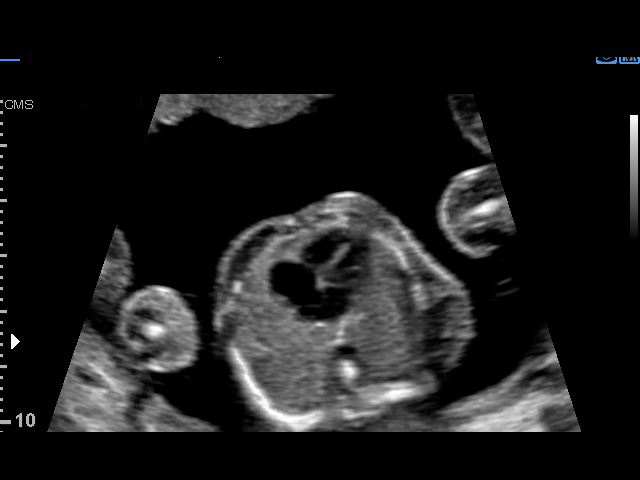
[im 63/86]
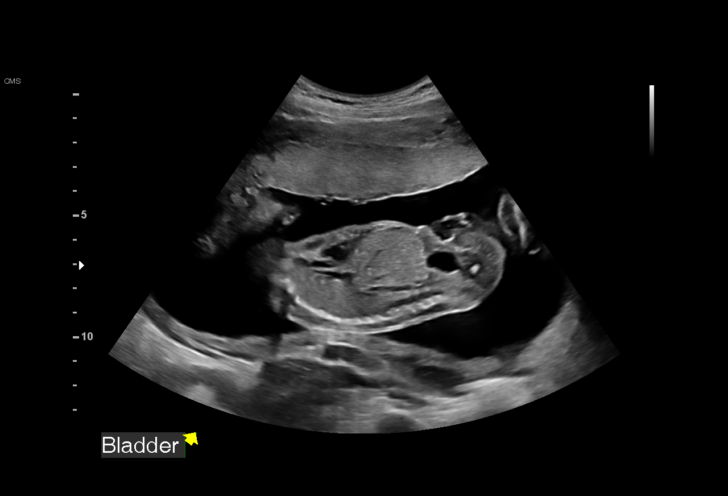
[im 70/86]
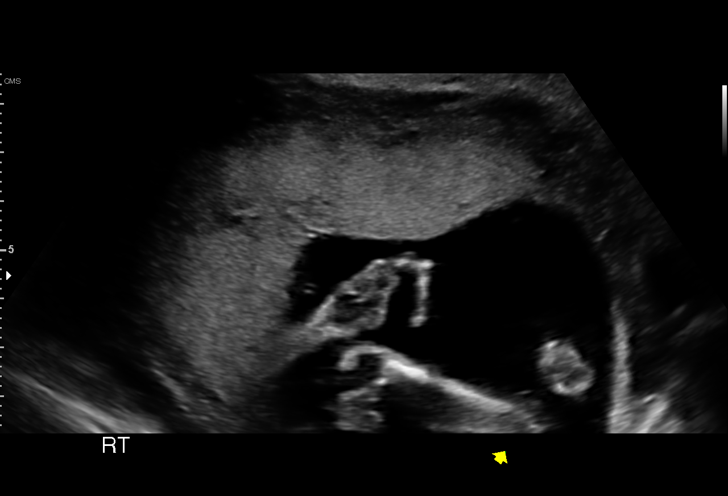
[im 76/86]
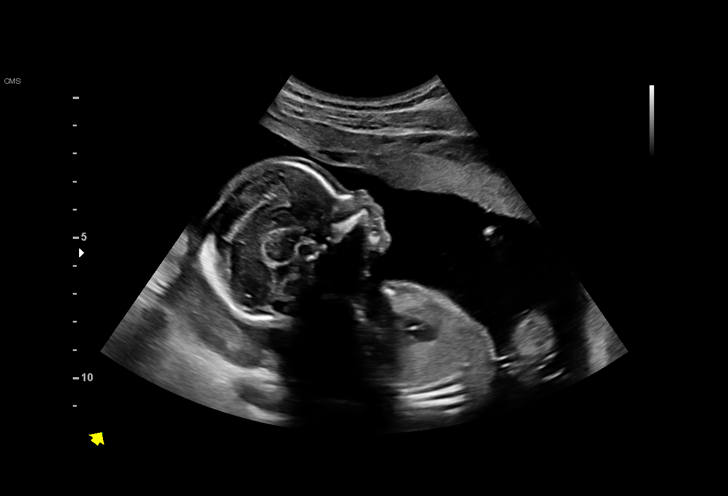
[im 82/86]
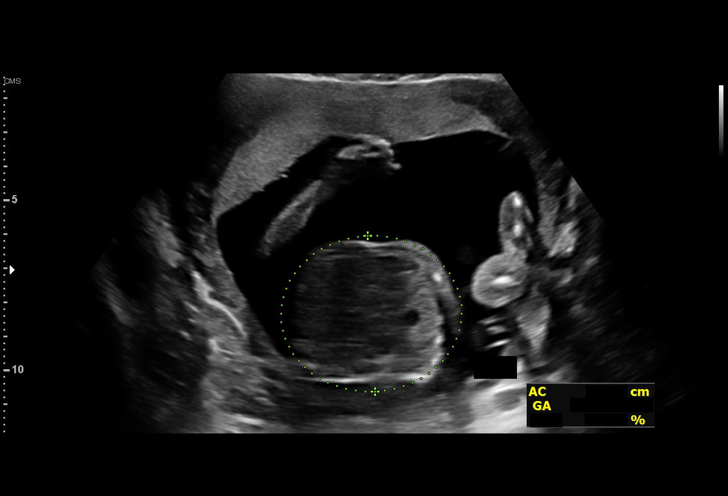

[13 of 28 positions shown; findings below may reference images not displayed]

[REDACTED]care - [REDACTED]

 ----------------------------------------------------------------------

 ----------------------------------------------------------------------
Indications

  Encounter for antenatal screening for
  malformations (low risk NIPS)
  History of sickle cell trait (FOB status
  unknown)
  19 weeks gestation of pregnancy
 ----------------------------------------------------------------------
Fetal Evaluation

 Num Of Fetuses:         1
 Fetal Heart Rate(bpm):  171
 Cardiac Activity:       Observed
 Presentation:           Breech
 Placenta:               Anterior
 P. Cord Insertion:      Visualized

 Amniotic Fluid
 AFI FV:      Within normal limits

                             Largest Pocket(cm)

Biometry

 BPD:      47.7  mm     G. Age:  20w 3d         83  %    CI:        72.77   %    70 - 86
                                                         FL/HC:      16.6   %    16.8 -
 HC:      177.8  mm     G. Age:  20w 2d         74  %    HC/AC:      1.22        1.09 -
 AC:      145.6  mm     G. Age:  19w 6d         55  %    FL/BPD:     61.8   %
 FL:       29.5  mm     G. Age:  19w 1d         26  %    FL/AC:      20.3   %    20 - 24
 HUM:      29.2  mm     G. Age:  19w 4d         51  %
 CER:      19.8  mm     G. Age:  18w 6d         36  %
 NFT:       4.5  mm
 CM:        4.8  mm
 Est. FW:     302  gm    0 lb 11 oz      48  %
OB History

 Gravidity:    1         Term:   0        Prem:   0        SAB:   0
 TOP:          0       Ectopic:  0        Living: 0
Gestational Age

 LMP:           19w 4d        Date:  04/16/18                 EDD:   01/21/19
 U/S Today:     20w 0d                                        EDD:   01/18/19
 Best:          19w 4d     Det. By:  LMP  (04/16/18)          EDD:   01/21/19
Anatomy

 Cranium:               Appears normal         LVOT:                   Appears normal
 Cavum:                 Appears normal         Aortic Arch:            Appears normal
 Ventricles:            Appears normal         Ductal Arch:            Appears normal
 Choroid Plexus:        Appears normal         Diaphragm:              Not well visualized
 Cerebellum:            Appears normal         Stomach:                Appears normal, left
                                                                       sided
 Posterior Fossa:       Appears normal         Abdomen:                Appears normal
 Nuchal Fold:           Appears normal         Abdominal Wall:         Appears nml (cord
                                                                       insert, abd wall)
 Face:                  Appears normal         Cord Vessels:           Appears normal (3
                        (orbits and profile)                           vessel cord)
 Lips:                  Appears normal         Kidneys:                Appear normal
 Palate:                Appears normal         Bladder:                Appears normal
 Thoracic:              Appears normal         Spine:                  Not well visualized
 Heart:                 Appears normal         Upper Extremities:      Appears normal
                        (4CH, axis, and
                        situs)
 RVOT:                  Appears normal         Lower Extremities:      Appears normal

 Other:  Heels and 5th digit visualized.
Cervix Uterus Adnexa

 Cervix
 Length:            3.1  cm.
 Normal appearance by transabdominal scan.

 Uterus
 No abnormality visualized.

 Left Ovary
 Within normal limits.

 Right Ovary
 Not visualized.

 Cul De Sac
 No free fluid seen.

 Adnexa
 No abnormality visualized.
Impression

 We performed fetal anatomy scan. No makers of
 aneuploidies or fetal structural defects are seen. Fetal
 biometry is consistent with her previously-established dates.
 Amniotic fluid is normal and good fetal activity is seen.

 On cell-free fetal DNA screening, the risks of fetal
 aneuploidies are not increased.
Recommendations

 An appointment was made for her to return in 4 weeks for
 completion of fetal anatomy.
                 [place], Hjzul

## 2019-07-28 IMAGING — US US MFM OB FOLLOW UP
1 series · 13 of 28 positions shown · non-contrast
Comparison: none

[Series 1: us mfm ob follow up · 13 of 32 slices shown]
[im 2/32]
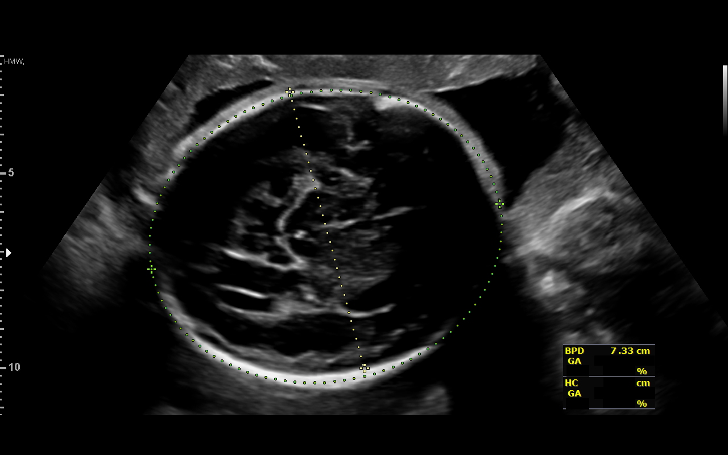
[im 4/32]
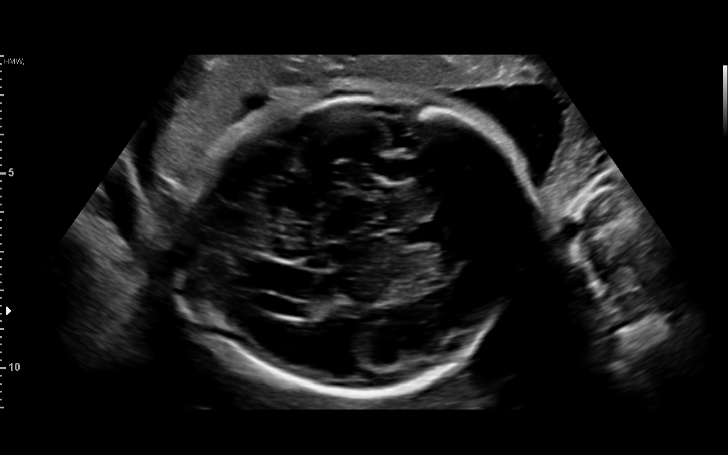
[im 6/32]
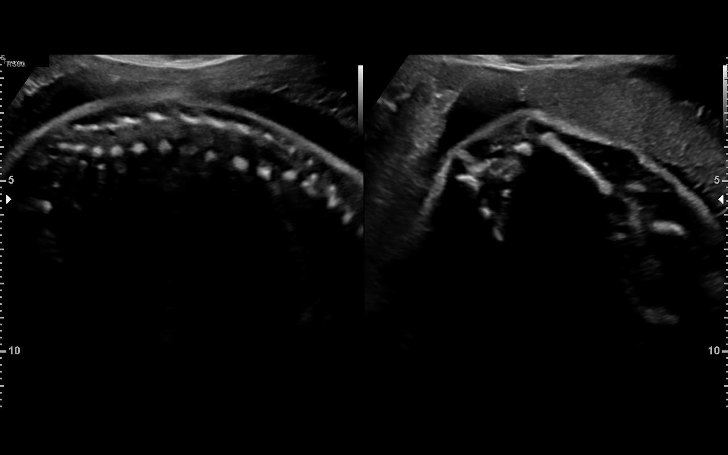
[im 9/32]
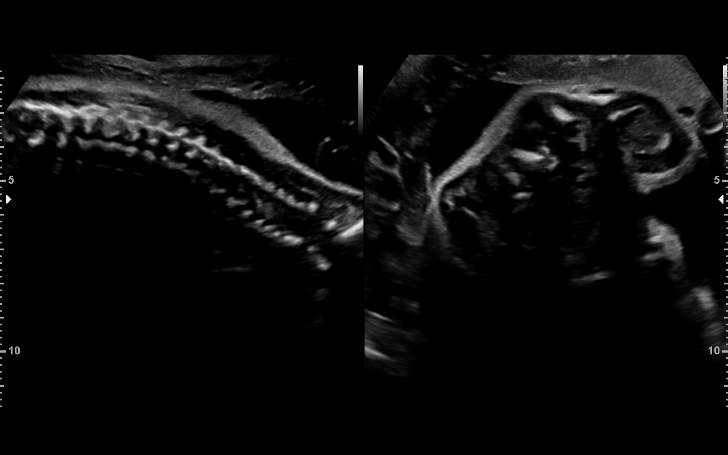
[im 11/32]
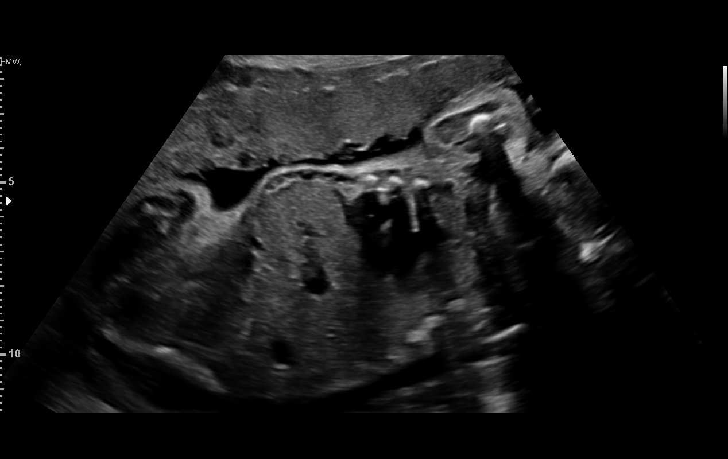
[im 13/32]
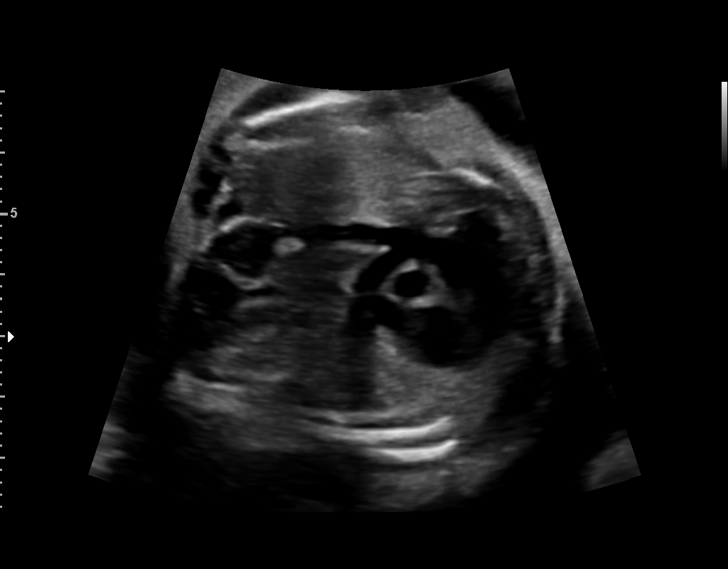
[im 17/32]
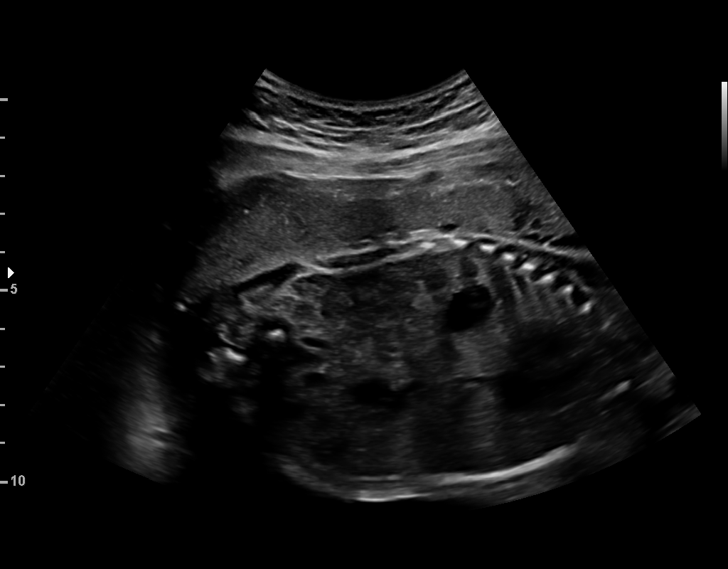
[im 19/32]
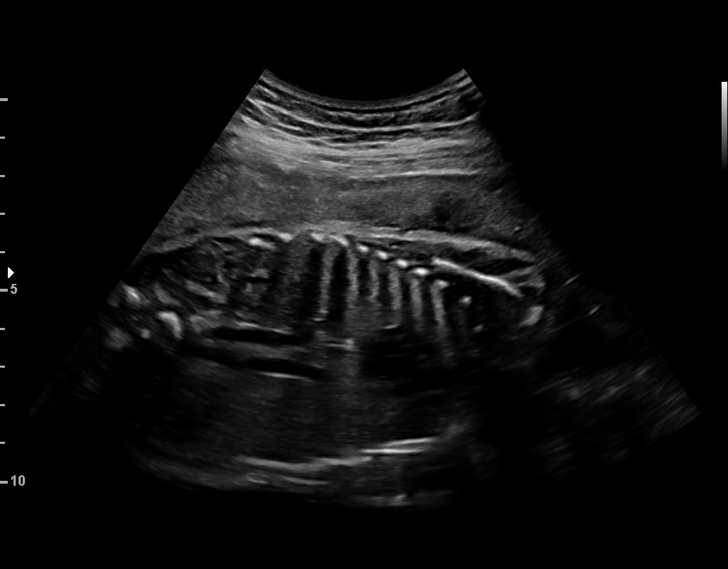
[im 21/32]
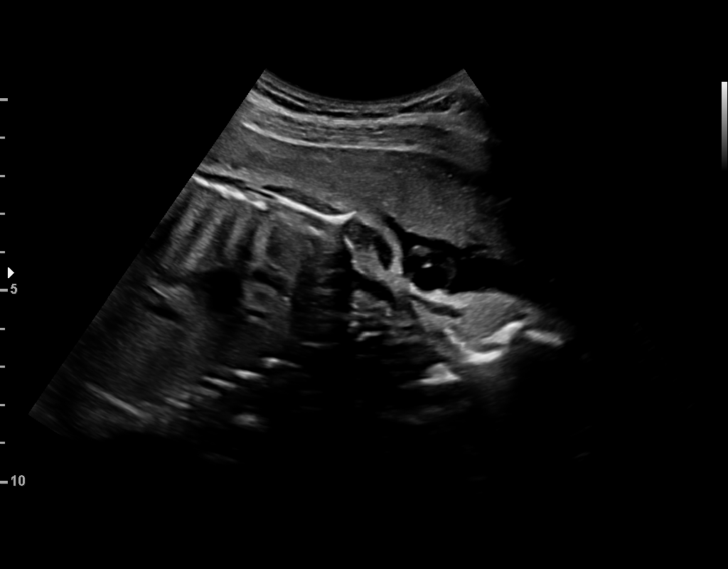
[im 23/32]
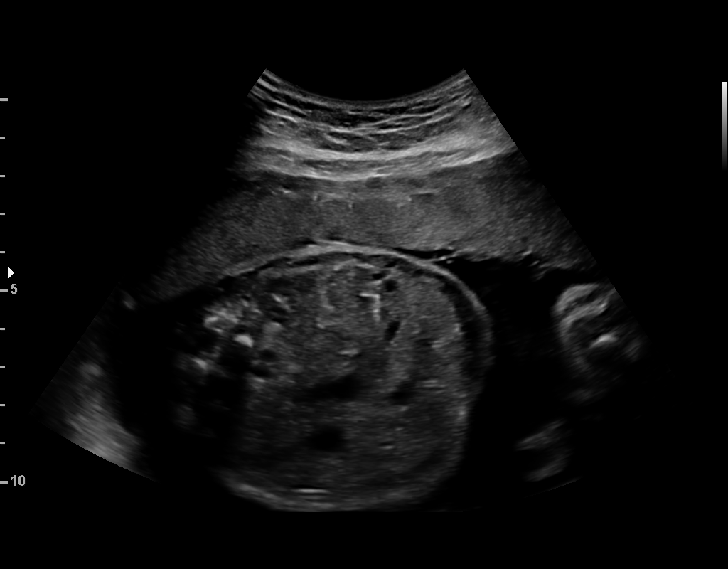
[im 26/32]
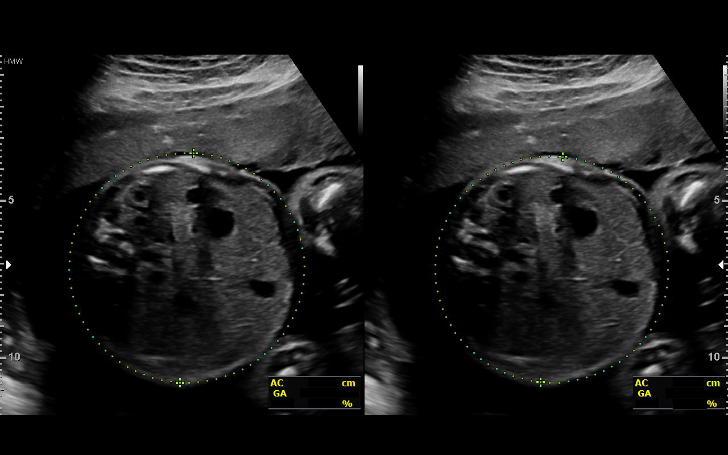
[im 28/32]
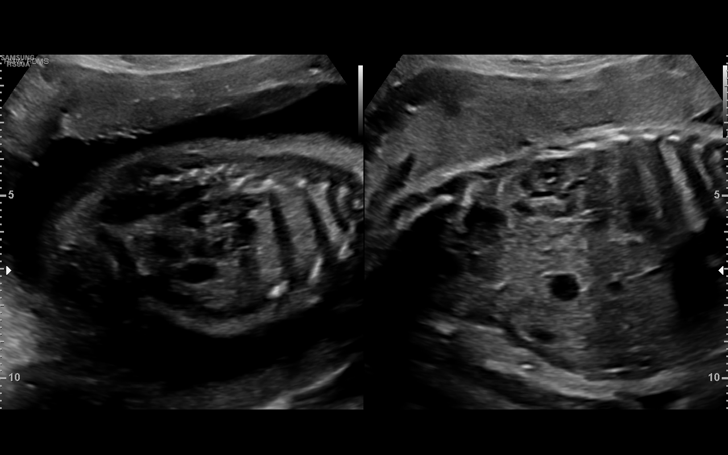
[im 30/32]
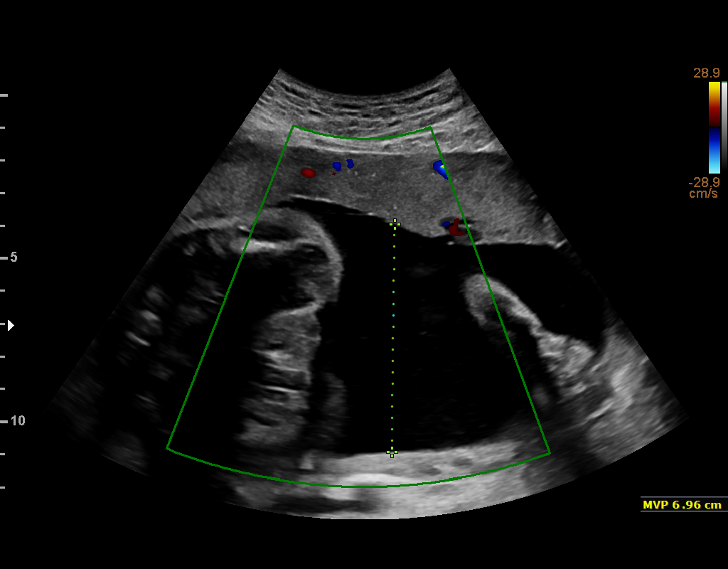

[13 of 28 positions shown; findings below may reference images not displayed]

----------------------------------------------------------------------

 ----------------------------------------------------------------------
Indications

  27 weeks gestation of pregnancy
  History of sickle cell trait (FOB status
  unknown)
  Encounter for other antenatal screening
  follow-up (low risk NIPS)
 ----------------------------------------------------------------------
Fetal Evaluation

 Num Of Fetuses:         1
 Fetal Heart Rate(bpm):  116
 Cardiac Activity:       Observed
 Presentation:           Cephalic
 Placenta:               Anterior
 P. Cord Insertion:      Previously Visualized

 Amniotic Fluid
 AFI FV:      Within normal limits

                             Largest Pocket(cm)

Biometry

 BPD:      73.4  mm     G. Age:  29w 3d         88  %    CI:        79.66   %    70 - 86
                                                         FL/HC:      20.2   %    18.8 -
 HC:      259.9  mm     G. Age:  28w 2d         38  %    HC/AC:      1.12        1.05 -
 AC:      231.7  mm     G. Age:  27w 4d         37  %    FL/BPD:     71.4   %    71 - 87
 FL:       52.4  mm     G. Age:  27w 6d         41  %    FL/AC:      22.6   %    20 - 24
 HUM:      46.3  mm     G. Age:  27w 2d         37  %

 Est. FW:    3398  gm      2 lb 8 oz     54  %
OB History
 Gravidity:    1         Term:   0        Prem:   0        SAB:   0
 TOP:          0       Ectopic:  0        Living: 0
Gestational Age

 LMP:           27w 5d        Date:  04/16/18                 EDD:   01/21/19
 U/S Today:     28w 2d                                        EDD:   01/17/19
 Best:          27w 5d     Det. By:  LMP  (04/16/18)          EDD:   01/21/19
Anatomy

 Cranium:               Appears normal         LVOT:                   Appears normal
 Cavum:                 Previously seen        Aortic Arch:            Previously seen
 Ventricles:            Appears normal         Ductal Arch:            Previously seen
 Choroid Plexus:        Previously seen        Diaphragm:              Appears normal
 Cerebellum:            Previously seen        Stomach:                Appears normal, left
                                                                       sided
 Posterior Fossa:       Previously seen        Abdomen:                Previously seen
 Nuchal Fold:           Previously seen        Abdominal Wall:         Previously seen
 Face:                  Orbits and profile     Cord Vessels:           Previously seen
                        previously seen
 Lips:                  Previously seen        Kidneys:                Appear normal
 Palate:                Previously seen        Bladder:                Appears normal
 Thoracic:              Appears normal         Spine:                  Appears normal
 Heart:                 Appears normal         Upper Extremities:      Previously seen
                        (4CH, axis, and
                        situs)
 RVOT:                  Appears normal         Lower Extremities:      Previously seen

 Other:  Heels and 5th digit visualized previously.
Cervix Uterus Adnexa

 Cervix
 Not visualized (advanced GA >28wks)

 Uterus
 No abnormality visualized.

 Left Ovary
 No adnexal mass visualized.

 Right Ovary
 No adnexal mass visualized.

 Cul De Sac
 No free fluid seen.

 Adnexa
 No abnormality visualized.
Impression

 Normal interval growth.
Recommendations

 Follow up growth as clinically indicated.

## 2019-09-07 ENCOUNTER — Ambulatory Visit (INDEPENDENT_AMBULATORY_CARE_PROVIDER_SITE_OTHER): Payer: 59 | Admitting: Family Medicine

## 2019-09-07 ENCOUNTER — Encounter: Payer: Self-pay | Admitting: Family Medicine

## 2019-09-07 ENCOUNTER — Other Ambulatory Visit: Payer: Self-pay

## 2019-09-07 ENCOUNTER — Other Ambulatory Visit (HOSPITAL_COMMUNITY)
Admission: RE | Admit: 2019-09-07 | Discharge: 2019-09-07 | Disposition: A | Discharge status: Home / Self Care | Payer: 59 | Source: Ambulatory Visit | Attending: Family Medicine | Admitting: Family Medicine

## 2019-09-07 VITALS — BP 105/81 | HR 101 | Ht 67.0 in | Wt 127.0 lb

## 2019-09-07 DIAGNOSIS — Z30017 Encounter for initial prescription of implantable subdermal contraceptive: Secondary | ICD-10-CM

## 2019-09-07 DIAGNOSIS — R87610 Atypical squamous cells of undetermined significance on cytologic smear of cervix (ASC-US): Secondary | ICD-10-CM

## 2019-09-07 DIAGNOSIS — R87810 Cervical high risk human papillomavirus (HPV) DNA test positive: Secondary | ICD-10-CM

## 2019-09-07 DIAGNOSIS — Z01419 Encounter for gynecological examination (general) (routine) without abnormal findings: Secondary | ICD-10-CM

## 2019-09-07 DIAGNOSIS — Z3202 Encounter for pregnancy test, result negative: Secondary | ICD-10-CM | POA: Diagnosis not present

## 2019-09-07 DIAGNOSIS — Z01812 Encounter for preprocedural laboratory examination: Secondary | ICD-10-CM

## 2019-09-07 LAB — POCT URINE PREGNANCY: Preg Test, Ur: NEGATIVE

## 2019-09-07 MED ORDER — ETONOGESTREL 68 MG ~~LOC~~ IMPL
68.0000 mg | DRUG_IMPLANT | Freq: Once | SUBCUTANEOUS | Status: AC
  Administered 2019-09-07: 68 mg via SUBCUTANEOUS

## 2019-09-07 NOTE — Progress Notes (Signed)
GYNECOLOGY ANNUAL PREVENTATIVE CARE ENCOUNTER NOTE  Subjective:   Angelica Gilbert is a 24 y.o. G68P1001 female here for a routine annual gynecologic exam.  Current complaints: would like to switch birth control. Finds that she is missing pills.  Denies abnormal vaginal bleeding, discharge, pelvic pain, problems with intercourse or other gynecologic concerns.    Gynecologic History No LMP recorded. Patient is sexually active  Contraception: OCP (estrogen/progesterone) Last Pap: 2019. Results were: ASCUS Last mammogram: n/a.  Obstetric History OB History  Gravida Para Term Preterm AB Living  1 1 1  0 0 1  SAB TAB Ectopic Multiple Live Births  0 0 0 0 1    # Outcome Date GA Lbr Len/2nd Weight Sex Delivery Anes PTL Lv  1 Term 01/10/19 [redacted]w[redacted]d 06:27 / 02:30 7 lb 2.6 oz (3.25 kg) M Vag-Spont EPI  LIV     Birth Comments: WNL    Past Medical History:  Diagnosis Date  . Medical history non-contributory     Past Surgical History:  Procedure Laterality Date  . WISDOM TOOTH EXTRACTION      Current Outpatient Medications on File Prior to Visit  Medication Sig Dispense Refill  . ibuprofen (ADVIL) 600 MG tablet Take 1 tablet (600 mg total) by mouth every 6 (six) hours. (Patient not taking: Reported on 02/23/2019) 30 tablet 0  . Norethindrone Acetate-Ethinyl Estrad-FE (LOESTRIN 24 FE) 1-20 MG-MCG(24) tablet Take 1 tablet by mouth daily. (Patient not taking: Reported on 09/07/2019) 3 Package 3  . Prenat w/o A Vit-FeFum-FePo-FA (FOLIVANE-OB) 130-92.4-1 MG CAPS Take 1 tablet by mouth daily. (Patient not taking: Reported on 02/23/2019) 30 capsule 10   No current facility-administered medications on file prior to visit.    No Known Allergies  Social History   Socioeconomic History  . Marital status: Single    Spouse name: Not on file  . Number of children: Not on file  . Years of education: Not on file  . Highest education level: Not on file  Occupational History  . Not on file  Tobacco  Use  . Smoking status: Never Smoker  . Smokeless tobacco: Never Used  Substance and Sexual Activity  . Alcohol use: Never  . Drug use: Never  . Sexual activity: Yes    Birth control/protection: None  Other Topics Concern  . Not on file  Social History Narrative  . Not on file   Social Determinants of Health   Financial Resource Strain:   . Difficulty of Paying Living Expenses:   Food Insecurity:   . Worried About 04/25/2019 in the Last Year:   . Programme researcher, broadcasting/film/video in the Last Year:   Transportation Needs:   . Barista (Medical):   Freight forwarder Lack of Transportation (Non-Medical):   Physical Activity:   . Days of Exercise per Week:   . Minutes of Exercise per Session:   Stress:   . Feeling of Stress :   Social Connections:   . Frequency of Communication with Friends and Family:   . Frequency of Social Gatherings with Friends and Family:   . Attends Religious Services:   . Active Member of Clubs or Organizations:   . Attends [employer] Meetings:   Banker Marital Status:   Intimate Partner Violence:   . Fear of Current or Ex-Partner:   . Emotionally Abused:   [employer] Physically Abused:   . Sexually Abused:     Family History  Problem Relation Age of Onset  .  Diabetes Father     The following portions of the patient's history were reviewed and updated as appropriate: allergies, current medications, past family history, past medical history, past social history, past surgical history and problem list.  Review of Systems Pertinent items are noted in HPI.   Objective:  BP 105/81   Pulse (!) 101   Ht 5\' 7"  (1.702 m)   Wt 127 lb (57.6 kg)   BMI 19.89 kg/m  Wt Readings from Last 3 Encounters:  09/07/19 127 lb (57.6 kg)  02/23/19 136 lb (61.7 kg)  01/09/19 160 lb 9 oz (72.8 kg)     Chaperone present during exam  CONSTITUTIONAL: Well-developed, well-nourished female in no acute distress.  HENT:  Normocephalic, atraumatic, External right and left ear  normal. Oropharynx is clear and moist EYES: Conjunctivae and EOM are normal. Pupils are equal, round, and reactive to light. No scleral icterus.  NECK: Normal range of motion, supple, no masses.  Normal thyroid.   CARDIOVASCULAR: Normal heart rate noted, regular rhythm RESPIRATORY: Clear to auscultation bilaterally. Effort and breath sounds normal, no problems with respiration noted. BREASTS: Symmetric in size. No masses, skin changes, nipple drainage, or lymphadenopathy. ABDOMEN: Soft, normal bowel sounds, no distention noted.  No tenderness, rebound or guarding.  PELVIC: Normal appearing external genitalia; normal appearing vaginal mucosa and cervix.  No abnormal discharge noted.  Normal uterine size, no other palpable masses, no uterine or adnexal tenderness. MUSCULOSKELETAL: Normal range of motion. No tenderness.  No cyanosis, clubbing, or edema.  2+ distal pulses. SKIN: Skin is warm and dry. No rash noted. Not diaphoretic. No erythema. No pallor. NEUROLOGIC: Alert and oriented to person, place, and time. Normal reflexes, muscle tone coordination. No cranial nerve deficit noted. PSYCHIATRIC: Normal mood and affect. Normal behavior. Normal judgment and thought content.  Nexplanon Insertion:  Patient given informed consent, signed copy in the chart, time out was performed. Pregnancy test was neg. Appropriate time out taken.  Patient's left arm was prepped and draped in the usual sterile fashion.. The ruler used to measure and mark insertion area.  Pt was prepped with alcohol swab and then injected with 5 cc of 1% lidocaine with epinephrine.  Pt was prepped with betadine, Implanon removed form packaging,  Device confirmed in needle, then inserted full length of needle and withdrawn per handbook instructions.  Device palpated by physician and patient.  Pt insertion site covered with pressure dressing.   Minimal blood loss.  Pt tolerated the procedure well.    Assessment:  Annual gynecologic  examination with pap smear   Plan:  1. Well Woman Exam Will follow up results of pap smear and manage accordingly.  2. 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  3. Pre-procedure lab exam - POCT urine pregnancy    Routine preventative health maintenance measures emphasized. Please refer to After Visit Summary for other counseling recommendations.    Loma Boston, [place] for [employer]

## 2019-09-07 NOTE — Patient Instructions (Signed)
Nexplanon Instructions After Insertion  Keep bandage clean and dry for 24 hours  May use ice/Tylenol/Ibuprofen for soreness or pain  If you develop fever, drainage or increased warmth from incision site-contact office immediately   

## 2019-09-07 NOTE — Progress Notes (Signed)
Patient would like another form of birth control- patient thinking about the Nexplanon. Her baby is eight months. Armandina Stammer RN

## 2019-09-25 ENCOUNTER — Other Ambulatory Visit: Payer: Self-pay | Admitting: Family Medicine

## 2019-09-25 ENCOUNTER — Telehealth: Payer: Self-pay

## 2019-09-25 MED ORDER — MEDROXYPROGESTERONE ACETATE 10 MG PO TABS: 10.0000 mg | ORAL_TABLET | Freq: Every day | ORAL | 0 refills | Status: DC

## 2019-09-25 NOTE — Telephone Encounter (Signed)
Pt made aware that Provera was sent to her pharmacy. Pt instructed to take 1 tab PO daily for 30 days then stop. Pt advised to call the office if the bleeding does not stop in 2-3 days. Understanding was voiced. Desirai Traxler l Bojarski, CMA

## 2019-09-25 NOTE — Telephone Encounter (Signed)
-----   Message from Levie Heritage, DO sent at 09/25/2019  2:54 PM EDT ----- Regarding: RE: AUB with Nexplanon I sent in a prescription for provera. Take 1 tab daily for 30 days, then stop. Please tell her to let us know if this doesn't stop the bleeding in 2-3 days. ----- Message ----- From: Mikey Bussing, CMA Sent: 09/25/2019   2:28 PM EDT To: Levie Heritage, DO Subject: AUB with Nexplanon                             Pt states she has been bleeding since she had the Nexplanon inserted on 09/07/19. She states the bleeding is lighter now but wants to know if she can take something to stop the bleeding.

## 2019-09-25 NOTE — Telephone Encounter (Signed)
Pt called requesting PAP smear results. Pt also states she has been bleeding since she the Nexplanon was inserted on 09/07/19. Pt states bleeding has slowed down. Pt made aware that PAP smear results are not back, she will receive a call once the PAP has resulted. Pt also made aware that a  message will be sent to provider about the bleeding. Understanding was voiced. Trentan Trippe l Suh, CMA

## 2019-10-23 ENCOUNTER — Other Ambulatory Visit: Payer: Self-pay | Admitting: Family Medicine

## 2019-10-26 LAB — CYTOLOGY - PAP: Diagnosis: HIGH — AB

## 2019-11-02 ENCOUNTER — Telehealth: Payer: Self-pay

## 2019-11-02 NOTE — Telephone Encounter (Signed)
Patient called and made aware that we did get her pap smear resulted. 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 Adrian Blackwater is recommending a colposcopy. Explained colposcopy procedure with patient and answered her questions.  Patient scheduled for June 17th. Angelica Stammer RN

## 2019-12-07 ENCOUNTER — Other Ambulatory Visit (HOSPITAL_COMMUNITY)
Admission: RE | Admit: 2019-12-07 | Discharge: 2019-12-07 | Disposition: A | Discharge status: Home / Self Care | Payer: BC Managed Care – PPO | Source: Ambulatory Visit | Attending: Family Medicine | Admitting: Family Medicine

## 2019-12-07 ENCOUNTER — Encounter: Payer: Self-pay | Admitting: Family Medicine

## 2019-12-07 ENCOUNTER — Other Ambulatory Visit: Payer: Self-pay

## 2019-12-07 ENCOUNTER — Ambulatory Visit (INDEPENDENT_AMBULATORY_CARE_PROVIDER_SITE_OTHER): Payer: Medicaid Other | Admitting: Family Medicine

## 2019-12-07 VITALS — BP 106/82 | HR 82 | Wt 126.0 lb

## 2019-12-07 DIAGNOSIS — R87613 High grade squamous intraepithelial lesion on cytologic smear of cervix (HGSIL): Secondary | ICD-10-CM

## 2019-12-07 DIAGNOSIS — N870 Mild cervical dysplasia: Secondary | ICD-10-CM

## 2019-12-07 NOTE — Patient Instructions (Signed)
Colposcopy, Care After This sheet gives you information about how to care for yourself after your procedure. Your doctor may also give you more specific instructions. If you have problems or questions, contact your doctor. What can I expect after the procedure? If you did not have a tissue sample removed (did not have a biopsy), you may only have some spotting for a few days. You can go back to your normal activities. If you had a tissue sample removed, it is common to have:  Soreness and pain. This may last for a few days.  Light-headedness.  Mild bleeding from your vagina or dark-colored, grainy discharge from your vagina. This may last for a few days. You may need to wear a sanitary pad.  Spotting for at least 48 hours after the procedure. Follow these instructions at home:   Take over-the-counter and prescription medicines only as told by your doctor. Ask your doctor what medicines you can start taking again. This is very important if you take blood-thinning medicine.  Do not drive or use heavy machinery while taking prescription pain medicine.  For 3 days, or as long as your doctor tells you, avoid: ? Douching. ? Using tampons. ? Having sex.  If you use birth control (contraception), keep using it.  Limit activity for the first day after the procedure. Ask your doctor what activities are safe for you.  It is up to you to get the results of your procedure. Ask your doctor when your results will be ready.  Keep all follow-up visits as told by your doctor. This is important. Contact a doctor if:  You get a skin rash. Get help right away if:  You are bleeding a lot from your vagina. It is a lot of bleeding if you are using more than one pad an hour for 2 hours in a row.  You have clumps of blood (blood clots) coming from your vagina.  You have a fever.  You have chills  You have pain in your lower belly (pelvic area).  You have signs of infection, such as vaginal  discharge that is: ? Different than usual. ? Yellow. ? Bad-smelling.  You have very pain or cramps in your lower belly that do not get better with medicine.  You feel light-headed.  You feel dizzy.  You pass out (faint). Summary  If you did not have a tissue sample removed (did not have a biopsy), you may only have some spotting for a few days. You can go back to your normal activities.  If you had a tissue sample removed, it is common to have mild pain and spotting for 48 hours.  For 3 days, or as long as your doctor tells you, avoid douching, using tampons and having sex.  Get help right away if you have bleeding, very bad pain, or signs of infection. This information is not intended to replace advice given to you by your health care provider. Make sure you discuss any questions you have with your health care provider. Document Revised: 05/21/2017 Document Reviewed: 02/26/2016 Elsevier Patient Education  2020 Elsevier Inc.  

## 2019-12-07 NOTE — Progress Notes (Signed)
Patient Name: Angelica Gilbert, female   DOB: 08-10-1995, 24 y.o.  MRN: [id number]  Colposcopy Procedure Note:  G1P1001 Pregnancy status: Unknown Indications: HSIL HPV:  Positive Cervical History:  Previous Abnormal Pap: ASCUS  Previous Colposcopy: none  Previous LEEP or Cryo: none  Smoking: Never Smoked Hysterectomy: No   Patient given informed consent, signed copy in the chart, time out was performed.    Exam: Vulva and Vagina grossly normal.  Cervix viewed with speculum and colposcope after application of acetic acid:  Cervix Fully Visualized Squamocolumnar Junction Visibility: Fully visualized  Acetowhite lesions: 5, 6, 7 and 8 o'clock  Other Lesions: None Punctation: Coarse  Mosaicism: Not present Abnormal vasculature: No   Biopsies: 5 and 7 o'clock ECC: Yes, brush  Hemostasis achieved with:  Monsel's Solution  Colposcopy Impression:  CIN2   Patient was given post procedure instructions.  Will call patient with results.

## 2019-12-08 LAB — SURGICAL PATHOLOGY

## 2019-12-11 ENCOUNTER — Encounter: Payer: Self-pay | Admitting: Family Medicine

## 2019-12-11 NOTE — Progress Notes (Signed)
Pt notified of results. Options discussed - excisional procedure or surveillance in 1 year - patient prefers surveillance.

## 2019-12-13 ENCOUNTER — Ambulatory Visit (INDEPENDENT_AMBULATORY_CARE_PROVIDER_SITE_OTHER): Payer: Medicaid Other | Admitting: Family Medicine

## 2019-12-13 ENCOUNTER — Telehealth: Payer: Self-pay

## 2019-12-13 ENCOUNTER — Other Ambulatory Visit: Payer: Self-pay

## 2019-12-13 ENCOUNTER — Encounter: Payer: Self-pay | Admitting: Family Medicine

## 2019-12-13 VITALS — BP 107/80 | HR 93 | Wt 126.0 lb

## 2019-12-13 DIAGNOSIS — N870 Mild cervical dysplasia: Secondary | ICD-10-CM

## 2019-12-13 NOTE — Progress Notes (Signed)
   Subjective:    Patient ID: Angelica Gilbert, female    DOB: 03-07-1996, 24 y.o.   MRN: [id number]  HPI  Seen for bleeding, cramping that occurred Monday. She had a colposcopy last week. She states that a large piece of tissue came out.  Review of Systems     Objective:   Physical Exam Vitals reviewed. Exam conducted with a chaperone present.  Constitutional:      Appearance: Normal appearance.  Abdominal:     Hernia: There is no hernia in the left inguinal area or right inguinal area.  Genitourinary:    Labia:        Right: No rash or tenderness.        Left: No rash or tenderness.      Vagina: Normal. No signs of injury. No vaginal discharge or tenderness.     Cervix: No cervical motion tenderness, friability or erythema.  Lymphadenopathy:     Lower Body: No right inguinal adenopathy. No left inguinal adenopathy.  Neurological:     Mental Status: She is alert.       Assessment & Plan:  1. 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 Probably passed endometrial cast. Cervix normal.

## 2019-12-13 NOTE — Telephone Encounter (Signed)
Pt called stating she had a Colpo on 12/07/19 and had some abdominal pain shortly after the procedure. Pt states she had some sharp pain and heavy bleeding on 12/11/19. Pt is scheduled  f/u. Janalee Grobe l Manetta, CMA

## 2020-09-19 ENCOUNTER — Ambulatory Visit: Payer: 59 | Admitting: Family Medicine

## 2020-09-19 ENCOUNTER — Encounter: Payer: Self-pay | Admitting: Family Medicine

## 2020-09-19 ENCOUNTER — Other Ambulatory Visit: Payer: Self-pay

## 2020-09-19 ENCOUNTER — Other Ambulatory Visit (HOSPITAL_COMMUNITY)
Admission: RE | Admit: 2020-09-19 | Discharge: 2020-09-19 | Disposition: A | Discharge status: Home / Self Care | Payer: Medicaid Other | Source: Ambulatory Visit | Attending: Family Medicine | Admitting: Family Medicine

## 2020-09-19 VITALS — Wt 129.0 lb

## 2020-09-19 DIAGNOSIS — N870 Mild cervical dysplasia: Secondary | ICD-10-CM | POA: Diagnosis not present

## 2020-09-19 DIAGNOSIS — Z3046 Encounter for surveillance of implantable subdermal contraceptive: Secondary | ICD-10-CM | POA: Diagnosis not present

## 2020-09-19 DIAGNOSIS — Z01419 Encounter for gynecological examination (general) (routine) without abnormal findings: Secondary | ICD-10-CM

## 2020-09-19 NOTE — Addendum Note (Signed)
Addended by: Mikey Bussing on: 09/19/2020 02:44 PM   Modules accepted: Orders

## 2020-09-19 NOTE — Progress Notes (Signed)
GYNECOLOGY ANNUAL PREVENTATIVE CARE ENCOUNTER NOTE  Subjective:   Angelica Gilbert is a 25 y.o. G52P1001 female here for a routine annual gynecologic exam.  Current complaints: mood swings, irregular bleeding with nexplanon.   Denies abnormal vaginal bleeding, discharge, pelvic pain, problems with intercourse or other gynecologic concerns.    Gynecologic History No LMP recorded. Patient has had an implant. Patient is sexually active  Contraception: Nexplanon Last Pap: 2021. Results were: abnormal  Obstetric History OB History  Gravida Para Term Preterm AB Living  1 1 1  0 0 1  SAB IAB Ectopic Multiple Live Births  0 0 0 0 1    # Outcome Date GA Lbr Len/2nd Weight Sex Delivery Anes PTL Lv  1 Term 01/10/19 [redacted]w[redacted]d 06:27 / 02:30 7 lb 2.6 oz (3.25 kg) M Vag-Spont EPI  LIV     Birth Comments: WNL    Past Medical History:  Diagnosis Date  . Medical history non-contributory     Past Surgical History:  Procedure Laterality Date  . WISDOM TOOTH EXTRACTION      Current Outpatient Medications on File Prior to Visit  Medication Sig Dispense Refill  . etonogestrel (NEXPLANON) 68 MG IMPL implant 1 each by Subdermal route once.    . medroxyPROGESTERone (PROVERA) 10 MG tablet TAKE 1 TABLET BY MOUTH EVERY DAY. USE FOR TEN DAYS (Patient not taking: No sig reported) 30 tablet 0  . Prenat w/o A Vit-FeFum-FePo-FA (FOLIVANE-OB) 130-92.4-1 MG CAPS Take 1 tablet by mouth daily. (Patient not taking: No sig reported) 30 capsule 10   No current facility-administered medications on file prior to visit.    No Known Allergies  Social History   Socioeconomic History  . Marital status: Single    Spouse name: Not on file  . Number of children: Not on file  . Years of education: Not on file  . Highest education level: Not on file  Occupational History  . Not on file  Tobacco Use  . Smoking status: Never Smoker  . Smokeless tobacco: Never Used  Vaping Use  . Vaping Use: Never used  Substance  and Sexual Activity  . Alcohol use: Never  . Drug use: Never  . Sexual activity: Yes    Birth control/protection: None  Other Topics Concern  . Not on file  Social History Narrative  . Not on file   Social Determinants of Health   Financial Resource Strain: Not on file  Food Insecurity: Not on file  Transportation Needs: Not on file  Physical Activity: Not on file  Stress: Not on file  Social Connections: Not on file  Intimate Partner Violence: Not on file    Family History  Problem Relation Age of Onset  . Diabetes Father     The following portions of the patient's history were reviewed and updated as appropriate: allergies, current medications, past family history, past medical history, past social history, past surgical history and problem list.  Review of Systems Pertinent items are noted in HPI.   Objective:  Wt 129 lb (58.5 kg)   BMI 20.20 kg/m  Wt Readings from Last 3 Encounters:  09/19/20 129 lb (58.5 kg)  12/13/19 126 lb (57.2 kg)  12/07/19 126 lb (57.2 kg)     Chaperone present during exam  CONSTITUTIONAL: Well-developed, well-nourished female in no acute distress.  HENT:  Normocephalic, atraumatic, External right and left ear normal. Oropharynx is clear and moist EYES: Conjunctivae and EOM are normal. Pupils are equal, round, and reactive to  light. No scleral icterus.  NECK: Normal range of motion, supple, no masses.  Normal thyroid.   CARDIOVASCULAR: Normal heart rate noted, regular rhythm RESPIRATORY: Clear to auscultation bilaterally. Effort and breath sounds normal, no problems with respiration noted. BREASTS: Symmetric in size. No masses, skin changes, nipple drainage, or lymphadenopathy. ABDOMEN: Soft, normal bowel sounds, no distention noted.  No tenderness, rebound or guarding.  PELVIC: Normal appearing external genitalia; normal appearing vaginal mucosa and cervix.  No abnormal discharge noted.  Normal uterine size, no other palpable masses, no  uterine or adnexal tenderness. MUSCULOSKELETAL: Normal range of motion. No tenderness.  No cyanosis, clubbing, or edema.  2+ distal pulses. SKIN: Skin is warm and dry. No rash noted. Not diaphoretic. No erythema. No pallor. NEUROLOGIC: Alert and oriented to person, place, and time. Normal reflexes, muscle tone coordination. No cranial nerve deficit noted. PSYCHIATRIC: Normal mood and affect. Normal behavior. Normal judgment and thought content.  Nexplanon Removal:  Patient given informed consent for removal of her Implanon, time out was performed.  Signed copy in the chart.  Appropriate time out taken. Implanon site identified.  Area prepped in usual sterile fashon. One cc of 1% lidocaine was used to anesthetize the area at the distal end of the implant. A small stab incision was made right beside the implant on the distal portion.  The implanon rod was grasped using hemostats and removed without difficulty.  There was less than 3 cc blood loss. There were no complications.  A small amount of antibiotic ointment and steri-strips were applied over the small incision.  A pressure bandage was applied to reduce any bruising.  The patient tolerated the procedure well and was given post procedure instructions. Assessment:  Annual gynecologic examination with pap smear   Plan:  1. Well Woman Exam Will follow up results of pap smear and manage accordingly. STD testing discussed. Patient requested testing   2. Nexplanon removal Successfully removed  3. 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 Rpt pap today.   Routine preventative health maintenance measures emphasized. Please refer to After Visit Summary for other counseling recommendations.    Candelaria Celeste, DO [facility]

## 2020-09-25 LAB — CYTOLOGY - PAP
Adequacy: ABSENT
Chlamydia: NEGATIVE
Comment: NEGATIVE
Comment: NEGATIVE
Comment: NORMAL
Diagnosis: UNDETERMINED — AB
High risk HPV: NEGATIVE
Neisseria Gonorrhea: NEGATIVE

## 2021-07-24 ENCOUNTER — Encounter: Payer: Self-pay | Admitting: General Practice

## 2022-02-26 ENCOUNTER — Ambulatory Visit: Payer: Medicaid Other | Admitting: Family Medicine

## 2022-05-07 ENCOUNTER — Encounter: Payer: Self-pay | Admitting: Family Medicine

## 2022-05-07 ENCOUNTER — Other Ambulatory Visit (HOSPITAL_COMMUNITY)
Admission: RE | Admit: 2022-05-07 | Discharge: 2022-05-07 | Disposition: A | Discharge status: Home / Self Care | Payer: Medicaid Other | Source: Ambulatory Visit | Attending: Family Medicine | Admitting: Family Medicine

## 2022-05-07 ENCOUNTER — Ambulatory Visit: Payer: Medicaid Other | Admitting: Family Medicine

## 2022-05-07 VITALS — BP 113/71 | HR 80 | Ht 67.0 in | Wt 137.0 lb

## 2022-05-07 DIAGNOSIS — Z8742 Personal history of other diseases of the female genital tract: Secondary | ICD-10-CM | POA: Insufficient documentation

## 2022-05-07 DIAGNOSIS — Z23 Encounter for immunization: Secondary | ICD-10-CM

## 2022-05-07 DIAGNOSIS — Z01419 Encounter for gynecological examination (general) (routine) without abnormal findings: Secondary | ICD-10-CM | POA: Insufficient documentation

## 2022-05-07 NOTE — Progress Notes (Signed)
   ANNUAL EXAM Patient name: Angelica Gilbert MRN [id number]  Date of birth: September 21, 1995 Chief Complaint:   Annual Exam  History of Present Illness:   Angelica Gilbert is a 26 y.o.  G63P1001  female  being seen today for a routine annual exam.  Current complaints: none Menses 28-30 day interval.   Patient's last menstrual period was 04/10/2022 (approximate).    Last pap 2022. Results were:  ASCUS neg HPV . H/O abnormal pap: yes Last mammogram: n/a      No data to display               No data to display           Review of Systems:   Pertinent items are noted in HPI Denies any headaches, blurred vision, fatigue, shortness of breath, chest pain, abdominal pain, abnormal vaginal discharge/itching/odor/irritation, problems with periods, bowel movements, urination, or intercourse unless otherwise stated above. Pertinent History Reviewed:  Reviewed past medical,surgical, social and family history.  Reviewed problem list, medications and allergies. Physical Assessment:   Vitals:   05/07/22 0812  BP: 113/71  Pulse: 80  Weight: 137 lb (62.1 kg)  Height: 5\' 7"  (1.702 m)  Body mass index is 21.46 kg/m.        Physical Examination:   General appearance - well appearing, and in no distress  Mental status - alert, oriented to person, place, and time  Psych:  She has a normal mood and affect  Skin - warm and dry, normal color, no suspicious lesions noted  Chest - effort normal, all lung fields clear to auscultation bilaterally  Heart - normal rate and regular rhythm  Neck:  midline trachea, no thyromegaly or nodules  Breasts - breasts appear normal, no suspicious masses, no skin or nipple changes or axillary nodes  Abdomen - soft, nontender, nondistended, no masses or organomegaly  Pelvic - VULVA: normal appearing vulva with no masses, tenderness or lesions  VAGINA: normal appearing vagina with normal color and discharge, no lesions  CERVIX: normal appearing cervix without discharge  or lesions, no CMT  Thin prep pap is done with HR HPV cotesting  UTERUS: uterus is felt to be normal size, shape, consistency and nontender   ADNEXA: No adnexal masses or tenderness noted.  Extremities:  No swelling or varicosities noted  Chaperone present for exam  Assessment & Plan:  1. Well woman exam with routine gynecological exam  2. History of abnormal cervical Pap smear PAP with HPV Gardisil   Labs/procedures today: none  No orders of the defined types were placed in this encounter.   Meds: No orders of the defined types were placed in this encounter.   Follow-up: No follow-ups on file.  , DO 05/07/2022 8:38 AM

## 2022-05-12 LAB — CYTOLOGY - PAP: Diagnosis: NEGATIVE

## 2022-07-07 ENCOUNTER — Ambulatory Visit: Payer: Medicaid Other

## 2022-07-23 ENCOUNTER — Ambulatory Visit (INDEPENDENT_AMBULATORY_CARE_PROVIDER_SITE_OTHER): Payer: Medicaid Other

## 2022-07-23 VITALS — BP 111/67 | HR 85

## 2022-07-23 DIAGNOSIS — Z23 Encounter for immunization: Secondary | ICD-10-CM

## 2022-07-23 NOTE — Progress Notes (Signed)
Patient presents for 2nd HPV vaccine. Patient notes some soreness in her arm last approx 3 weeks with the first injection. Patient scheduled third HPV injection before leaving today.Kathrene Alu RN

## 2022-08-10 ENCOUNTER — Other Ambulatory Visit (HOSPITAL_COMMUNITY)
Admission: RE | Admit: 2022-08-10 | Discharge: 2022-08-10 | Disposition: A | Discharge status: Home / Self Care | Payer: Medicaid Other | Source: Ambulatory Visit | Attending: Family Medicine | Admitting: Family Medicine

## 2022-08-10 ENCOUNTER — Ambulatory Visit: Payer: Medicaid Other | Admitting: Family Medicine

## 2022-08-10 ENCOUNTER — Encounter: Payer: Self-pay | Admitting: Family Medicine

## 2022-08-10 VITALS — BP 117/69 | HR 103 | Wt 134.0 lb

## 2022-08-10 DIAGNOSIS — N898 Other specified noninflammatory disorders of vagina: Secondary | ICD-10-CM

## 2022-08-10 NOTE — Progress Notes (Signed)
   Subjective:    Patient ID: Angelica Gilbert is a 27 y.o. female presenting with No chief complaint on file.  on 08/10/2022  HPI: In December had sex and then developed BV and yeast. Then recurred in January. Also, had BV after her menses.She denies fever, chills, nausea or vomiting.  Review of Systems  Constitutional:  Negative for chills and fever.  Respiratory:  Negative for shortness of breath.   Cardiovascular:  Negative for chest pain.  Gastrointestinal:  Negative for abdominal pain, nausea and vomiting.  Genitourinary:  Negative for dysuria.  Skin:  Negative for rash.      Objective:    BP 117/69   Pulse (!) 103   Wt 134 lb (60.8 kg)   LMP 07/16/2022 (Exact Date)   BMI 20.99 kg/m  Physical Exam Exam conducted with a chaperone present.  Constitutional:      General: She is not in acute distress.    Appearance: She is well-developed.  HENT:     Head: Normocephalic and atraumatic.  Eyes:     General: No scleral icterus. Cardiovascular:     Rate and Rhythm: Normal rate.  Pulmonary:     Effort: Pulmonary effort is normal.  Abdominal:     Palpations: Abdomen is soft.  Genitourinary:    Comments: BUS normal, vagina is pink and rugated, thin white discharge noted, cervix is parous without lesion, uterus is small and anteverted, no adnexal mass or tenderness.  Musculoskeletal:     Cervical back: Neck supple.  Skin:    General: Skin is warm and dry.  Neurological:     Mental Status: She is alert and oriented to person, place, and time.         Assessment & Plan:  Vaginal irritation - per pt, would like to be checked for GC/Chlam, declines HIV, RPR, Hep B/C screen. We discussed why she might be getting recurrent vaginitis. Rx as appropriate. - Plan: Cervicovaginal ancillary only( [facility])   Return if symptoms worsen or fail to improve.  Donnamae Jude, MD 08/10/2022 9:57 AM

## 2022-08-11 ENCOUNTER — Telehealth: Payer: Self-pay

## 2022-08-11 DIAGNOSIS — B9689 Other specified bacterial agents as the cause of diseases classified elsewhere: Secondary | ICD-10-CM

## 2022-08-11 LAB — CERVICOVAGINAL ANCILLARY ONLY
Bacterial Vaginitis (gardnerella): POSITIVE — AB
Candida Glabrata: NEGATIVE
Candida Vaginitis: NEGATIVE
Chlamydia: NEGATIVE
Comment: NEGATIVE
Comment: NEGATIVE
Comment: NEGATIVE
Comment: NEGATIVE
Comment: NEGATIVE
Comment: NORMAL
Neisseria Gonorrhea: NEGATIVE
Trichomonas: NEGATIVE

## 2022-08-11 MED ORDER — METRONIDAZOLE 500 MG PO TABS: 500.0000 mg | ORAL_TABLET | Freq: Two times a day (BID) | ORAL | 0 refills | Status: AC

## 2022-08-11 NOTE — Telephone Encounter (Signed)
Patient called requesting results. Patient made aware that she tested positive for BV. Metronidazole 500 mg 1 tablet BID x 7 days was sent to her pharmacy. Understanding was voiced. Camala Talwar l Glotfelty, CMA

## 2022-08-11 NOTE — Telephone Encounter (Signed)
-----   Message from Maurine Minister, [place] sent at 08/11/2022  3:47 PM EST ----- Regarding: Lab results Please call patient.

## 2022-09-16 ENCOUNTER — Ambulatory Visit: Payer: Medicaid Other

## 2022-11-03 ENCOUNTER — Ambulatory Visit: Payer: Medicaid Other

## 2022-11-26 ENCOUNTER — Ambulatory Visit (INDEPENDENT_AMBULATORY_CARE_PROVIDER_SITE_OTHER): Payer: Medicaid Other | Admitting: Family Medicine

## 2022-11-26 ENCOUNTER — Encounter: Payer: Self-pay | Admitting: Family Medicine

## 2022-11-26 ENCOUNTER — Ambulatory Visit: Payer: Medicaid Other

## 2022-11-26 VITALS — BP 115/76 | HR 85 | Ht 67.0 in | Wt 127.0 lb

## 2022-11-26 DIAGNOSIS — Z30011 Encounter for initial prescription of contraceptive pills: Secondary | ICD-10-CM | POA: Diagnosis not present

## 2022-11-26 DIAGNOSIS — Z23 Encounter for immunization: Secondary | ICD-10-CM | POA: Diagnosis not present

## 2022-11-26 MED ORDER — NORETHIN ACE-ETH ESTRAD-FE 1-20 MG-MCG(24) PO TABS: 1.0000 | ORAL_TABLET | Freq: Every day | ORAL | 3 refills | Status: AC

## 2022-11-26 NOTE — Progress Notes (Signed)
   Subjective:    Patient ID: Angelica Gilbert, female    DOB: Oct 06, 1995, 27 y.o.   MRN: [id number]  HPI  Patient here for birth control. Used depo and nexplanon last - she had prolonged bleeding with these. Tolerated COC before.  No history of clotting disorder. No migraine headaches. Nonsmoker  Review of Systems     Objective:   Physical Exam Vitals reviewed.  Constitutional:      Appearance: Normal appearance.  Cardiovascular:     Rate and Rhythm: Normal rate.  Pulmonary:     Effort: Pulmonary effort is normal.  Abdominal:     General: Abdomen is flat.     Palpations: Abdomen is soft.  Neurological:     Mental Status: She is alert.  Psychiatric:        Mood and Affect: Mood normal.        Behavior: Behavior normal.        Thought Content: Thought content normal.        Judgment: Judgment normal.           Assessment & Plan:  1. Encounter for initial prescription of contraceptive pills Discussed side effects. Will place on loestrin. Discussed when to start, using condoms for the first month, etc.  2. Need for HPV vaccine  - HPV 9-valent vaccine,Recombinat

## 2022-11-26 NOTE — Progress Notes (Signed)
Patient presents for 3rd HPV vaccine. Patient tolerated well.
